# Patient Record
Sex: Female | Born: 1995
Health system: Southern US, Community
[De-identification: ages and names within clinical notes are randomized; demographics above are authoritative.]

## PROBLEM LIST (undated history)

## (undated) ENCOUNTER — Inpatient Hospital Stay: Payer: Self-pay

## (undated) DIAGNOSIS — O99213 Obesity complicating pregnancy, third trimester: Secondary | ICD-10-CM

## (undated) DIAGNOSIS — O99019 Anemia complicating pregnancy, unspecified trimester: Secondary | ICD-10-CM

## (undated) DIAGNOSIS — R51 Headache: Secondary | ICD-10-CM

## (undated) DIAGNOSIS — E669 Obesity, unspecified: Secondary | ICD-10-CM

## (undated) DIAGNOSIS — J45909 Unspecified asthma, uncomplicated: Secondary | ICD-10-CM

## (undated) HISTORY — DX: Obesity, unspecified: E66.9

## (undated) HISTORY — DX: Headache: R51

---
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
## 2016-08-26 ENCOUNTER — Encounter: Payer: Self-pay | Admitting: Emergency Medicine

## 2016-08-26 DIAGNOSIS — R1013 Epigastric pain: Secondary | ICD-10-CM | POA: Insufficient documentation

## 2016-08-26 DIAGNOSIS — J45909 Unspecified asthma, uncomplicated: Secondary | ICD-10-CM | POA: Insufficient documentation

## 2016-08-26 DIAGNOSIS — R1011 Right upper quadrant pain: Secondary | ICD-10-CM | POA: Insufficient documentation

## 2016-08-26 DIAGNOSIS — R110 Nausea: Secondary | ICD-10-CM | POA: Insufficient documentation

## 2016-08-26 DIAGNOSIS — Z791 Long term (current) use of non-steroidal anti-inflammatories (NSAID): Secondary | ICD-10-CM | POA: Insufficient documentation

## 2016-08-26 LAB — URINALYSIS, COMPLETE (UACMP) WITH MICROSCOPIC
BILIRUBIN URINE: NEGATIVE
Glucose, UA: NEGATIVE mg/dL
KETONES UR: 20 mg/dL — AB
Nitrite: NEGATIVE
PH: 5 (ref 5.0–8.0)
Protein, ur: 100 mg/dL — AB
Specific Gravity, Urine: 1.032 — ABNORMAL HIGH (ref 1.005–1.030)

## 2016-08-26 LAB — COMPREHENSIVE METABOLIC PANEL
ALK PHOS: 75 U/L (ref 38–126)
ALT: 26 U/L (ref 14–54)
AST: 30 U/L (ref 15–41)
Albumin: 4.6 g/dL (ref 3.5–5.0)
Anion gap: 6 (ref 5–15)
BILIRUBIN TOTAL: 1.4 mg/dL — AB (ref 0.3–1.2)
BUN: 11 mg/dL (ref 6–20)
CHLORIDE: 108 mmol/L (ref 101–111)
CO2: 23 mmol/L (ref 22–32)
Calcium: 9.4 mg/dL (ref 8.9–10.3)
Creatinine, Ser: 0.95 mg/dL (ref 0.44–1.00)
GFR calc non Af Amer: >60 mL/min (ref >60)
GLUCOSE: 102 mg/dL — AB (ref 65–99)
Potassium: 3.2 mmol/L — ABNORMAL LOW (ref 3.5–5.1)
Sodium: 137 mmol/L (ref 135–145)
Total Protein: 7.9 g/dL (ref 6.5–8.1)

## 2016-08-26 LAB — CBC
HEMATOCRIT: 39.1 % (ref 35.0–47.0)
Hemoglobin: 13.2 g/dL (ref 12.0–16.0)
MCH: 29.3 pg (ref 26.0–34.0)
MCHC: 33.8 g/dL (ref 32.0–36.0)
MCV: 86.6 fL (ref 80.0–100.0)
PLATELETS: 296 10*3/uL (ref 150–440)
RBC: 4.52 MIL/uL (ref 3.80–5.20)
RDW: 13.8 % (ref 11.5–14.5)
WBC: 13.1 10*3/uL — AB (ref 3.6–11.0)

## 2016-08-26 LAB — LIPASE, BLOOD: Lipase: 20 U/L (ref 11–51)

## 2016-08-26 MED ORDER — GI COCKTAIL ~~LOC~~
ORAL | Status: AC
  Administered 2016-08-26: 30 mL
  Filled 2016-08-26: qty 30

## 2016-08-26 NOTE — ED Triage Notes (Signed)
Pt in with co epigastric pain x 3 hours after drinking kool-aid. States vomited x 2 no diarrhea.

## 2016-08-27 ENCOUNTER — Emergency Department: Payer: Self-pay

## 2016-08-27 ENCOUNTER — Emergency Department
Admission: EM | Admit: 2016-08-27 | Discharge: 2016-08-27 | Disposition: A | Discharge status: Home / Self Care | Payer: Self-pay | Attending: Student in an Organized Health Care Education/Training Program | Admitting: Student in an Organized Health Care Education/Training Program

## 2016-08-27 DIAGNOSIS — R110 Nausea: Secondary | ICD-10-CM

## 2016-08-27 DIAGNOSIS — R1013 Epigastric pain: Secondary | ICD-10-CM

## 2016-08-27 DIAGNOSIS — R109 Unspecified abdominal pain: Secondary | ICD-10-CM

## 2016-08-27 LAB — HCG, QUANTITATIVE, PREGNANCY: hCG, Beta Chain, Quant, S: 1 m[IU]/mL (ref <5)

## 2016-08-27 MED ORDER — PROMETHAZINE HCL 25 MG/ML IJ SOLN
25.0000 mg | Freq: Once | INTRAMUSCULAR | Status: DC
  Filled 2016-08-27: qty 1

## 2016-08-27 MED ORDER — SODIUM CHLORIDE 0.9 % IV BOLUS (SEPSIS)
1000.0000 mL | Freq: Once | INTRAVENOUS | Status: AC
  Administered 2016-08-27: 1000 mL via INTRAVENOUS

## 2016-08-27 MED ORDER — PROMETHAZINE HCL 25 MG/ML IJ SOLN
25.0000 mg | Freq: Once | INTRAMUSCULAR | Status: AC
  Administered 2016-08-27: 25 mg via INTRAVENOUS

## 2016-08-27 MED ORDER — KETOROLAC TROMETHAMINE 30 MG/ML IJ SOLN
30.0000 mg | Freq: Once | INTRAMUSCULAR | Status: DC
  Filled 2016-08-27: qty 1

## 2016-08-27 MED ORDER — KETOROLAC TROMETHAMINE 30 MG/ML IJ SOLN
30.0000 mg | Freq: Once | INTRAMUSCULAR | Status: AC
  Administered 2016-08-27: 30 mg via INTRAVENOUS

## 2016-08-27 MED ORDER — PROMETHAZINE HCL 12.5 MG PO TABS: 12.5000 mg | ORAL_TABLET | Freq: Four times a day (QID) | ORAL | 0 refills | Status: DC | PRN

## 2016-08-27 MED ORDER — FENTANYL CITRATE (PF) 100 MCG/2ML IJ SOLN
50.0000 ug | INTRAMUSCULAR | Status: DC | PRN
  Administered 2016-08-27: 50 ug via INTRAVENOUS
  Filled 2016-08-27: qty 2

## 2016-08-27 NOTE — ED Notes (Signed)
Reviewed d/c instruction, follow-up care and prescription with patient and patient's mother. Pt/mother verbalized understanding

## 2016-08-27 NOTE — ED Provider Notes (Signed)
[facility] Emergency Department Provider Note    First MD Initiated Contact with Patient 08/27/16 0327     (approximate)  I have reviewed the triage vital signs and the nursing notes.   HISTORY  Chief Complaint Abdominal Pain    HPI Amanda Buck is a 21 y.o. female history of obesity presents with acute epigastric pain rates as 10 out of 10 in severity that started around 8 PM after the patient drank Kool-Aid at home. Has had multiple episodes of nonbilious nonbloody emesis. No measured fevers. Has never had symptoms like this before. States the pain is radiating through to her back. No diarrhea. No dysuria. No flank pain.   Past Medical History:  Diagnosis Date  . Anemia affecting pregnancy   . Asthma    No current treatment needed  . Headache(784.0)   . Obesity affecting pregnancy in third trimester, antepartum    Family History  Problem Relation Age of Onset  . ALS Paternal Grandmother        Died at 37  . Heart failure Paternal Grandfather        Died at 81   Past Surgical History:  Procedure Laterality Date  . NO PAST SURGERIES     Patient Active Problem List   Diagnosis Date Noted  . Postpartum care following vaginal delivery 11/30/2015      Prior to Admission medications   Medication Sig Start Date End Date Taking? Authorizing Provider  ibuprofen (ADVIL,MOTRIN) 600 MG tablet Take 1 tablet (600 mg total) by mouth every 6 (six) hours as needed for headache, mild pain, moderate pain or cramping. 12/02/15   Farrel Conners, CNM  Prenatal Vit-Fe Fumarate-FA (PRENATAL MULTIVITAMIN) TABS tablet Take 1 tablet by mouth daily at 12 noon. 12/02/15   Farrel Conners, CNM    Allergies Patient has no known allergies.    Social History Social History  Substance Use Topics  . Smoking status: Never Smoker  . Smokeless tobacco: Never Used  . Alcohol use No    Review of Systems Patient denies headaches, rhinorrhea, blurry vision,  numbness, shortness of breath, chest pain, edema, cough, abdominal pain, nausea, vomiting, diarrhea, dysuria, fevers, rashes or hallucinations unless otherwise stated above in HPI. ____________________________________________   PHYSICAL EXAM:  VITAL SIGNS: Vitals:   08/26/16 2318  BP: 119/76  Pulse: 68  Resp: 20  Temp: 98.2 F (36.8 C)    Constitutional: Alert and oriented. in no acute distress. Eyes: Conjunctivae are normal. PERRL. EOMI. Head: Atraumatic. Nose: No congestion/rhinnorhea. Mouth/Throat: Mucous membranes are moist.  Oropharynx non-erythematous. Neck: No stridor. Painless ROM. No cervical spine tenderness to palpation Hematological/Lymphatic/Immunilogical: No cervical lymphadenopathy. Cardiovascular: Normal rate, regular rhythm. Grossly normal heart sounds.  Good peripheral circulation. Respiratory: Normal respiratory effort.  No retractions. Lungs CTAB. Gastrointestinal: Soft with mild ruq ttp, no rebound or guarding. No distention. No abdominal bruits. No CVA tenderness. Musculoskeletal: No lower extremity tenderness nor edema.  No joint effusions. Neurologic:  Normal speech and language. No gross focal neurologic deficits are appreciated. No gait instability. Skin:  Skin is warm, dry and intact. No rash noted. Psychiatric: Mood and affect are normal. Speech and behavior are normal.  ____________________________________________   LABS (all labs ordered are listed, but only abnormal results are displayed)  Results for orders placed or performed during the hospital encounter of 08/27/16 (from the past 24 hour(s))  CBC     Status: Abnormal   Collection Time: 08/26/16 11:18 PM  Result Value Ref Range  WBC 13.1 (H) 3.6 - 11.0 K/uL   RBC 4.52 3.80 - 5.20 MIL/uL   Hemoglobin 13.2 12.0 - 16.0 g/dL   HCT 16.1 09.6 - 04.5 %   MCV 86.6 80.0 - 100.0 fL   MCH 29.3 26.0 - 34.0 pg   MCHC 33.8 32.0 - 36.0 g/dL   RDW 40.9 81.1 - 91.4 %   Platelets 296 150 - 440 K/uL    Comprehensive metabolic panel     Status: Abnormal   Collection Time: 08/26/16 11:18 PM  Result Value Ref Range   Sodium 137 135 - 145 mmol/L   Potassium 3.2 (L) 3.5 - 5.1 mmol/L   Chloride 108 101 - 111 mmol/L   CO2 23 22 - 32 mmol/L   Glucose, Bld 102 (H) 65 - 99 mg/dL   BUN 11 6 - 20 mg/dL   Creatinine, Ser 7.82 0.44 - 1.00 mg/dL   Calcium 9.4 8.9 - 95.6 mg/dL   Total Protein 7.9 6.5 - 8.1 g/dL   Albumin 4.6 3.5 - 5.0 g/dL   AST 30 15 - 41 U/L   ALT 26 14 - 54 U/L   Alkaline Phosphatase 75 38 - 126 U/L   Total Bilirubin 1.4 (H) 0.3 - 1.2 mg/dL   GFR calc non Af Amer >60 >60 mL/min   GFR calc Af Amer >60 >60 mL/min   Anion gap 6 5 - 15  Lipase, blood     Status: None   Collection Time: 08/26/16 11:18 PM  Result Value Ref Range   Lipase 20 11 - 51 U/L  Urinalysis, Complete w Microscopic     Status: Abnormal   Collection Time: 08/26/16 11:23 PM  Result Value Ref Range   Color, Urine AMBER (A) YELLOW   APPearance CLOUDY (A) CLEAR   Specific Gravity, Urine 1.032 (H) 1.005 - 1.030   pH 5.0 5.0 - 8.0   Glucose, UA NEGATIVE NEGATIVE mg/dL   Hgb urine dipstick SMALL (A) NEGATIVE   Bilirubin Urine NEGATIVE NEGATIVE   Ketones, ur 20 (A) NEGATIVE mg/dL   Protein, ur 213 (A) NEGATIVE mg/dL   Nitrite NEGATIVE NEGATIVE   Leukocytes, UA LARGE (A) NEGATIVE   RBC / HPF 6-30 0 - 5 RBC/hpf   WBC, UA TOO NUMEROUS TO COUNT 0 - 5 WBC/hpf   Bacteria, UA MANY (A) NONE SEEN   Squamous Epithelial / LPF TOO NUMEROUS TO COUNT (A) NONE SEEN   Mucous PRESENT    Non Squamous Epithelial 0-5 (A) NONE SEEN   ____________________________________________  EKG________________________________  RADIOLOGY  I personally reviewed all radiographic images ordered to evaluate for the above acute complaints and reviewed radiology reports and findings.  These findings were personally discussed with the patient.  Please see medical record for radiology  report.  ____________________________________________   PROCEDURES  Procedure(s) performed:  Procedures    Critical Care performed: no ____________________________________________   INITIAL IMPRESSION / ASSESSMENT AND PLAN / ED COURSE  Pertinent labs & imaging results that were available during my care of the patient were reviewed by me and considered in my medical decision making (see chart for details).  DDX: cholelithiasis,gastritis, enteritis, esohagitis, cholecytitis, ood poisoning  Amanda Buck is a 21 y.o. who presents to the ED with acute right upper quadrant and epigastric pain as described above. Patient afebrile and nontoxic-appearing. Does have mild tenderness in the upper quadrant with mild leukocytosis and mild elevation of her bilirubin. Will order ultrasound evaluate for any evidence of biliary pathology. Do not  feel that CT imaging is clinically indicated at this time. We'll provide symptomatically management IV fluids.    ----------------------------------------- 6:56 AM on 08/27/2016 -----------------------------------------  Ultrasound shows no acute abnormality. Patient resting comfortably and repeat abdominal exam is soft and benign.  Patient adamantly denies that she has no dysuria or flank pain. No increased frequency or change in odor. Patient with multiple squames on her urinalysis which suggests this is a contaminated specimen. Do not feel this is related to her symptoms.  Patient was able to tolerate PO and was able to ambulate with a steady gait.  Have discussed with the patient and available family all diagnostics and treatments performed thus far and all questions were answered to the best of my ability. The patient demonstrates understanding and agreement with plan.   ____________________________________________   FINAL CLINICAL IMPRESSION(S) / ED DIAGNOSES  Final diagnoses:  Abdominal pain  Epigastric abdominal pain  Nausea      NEW  MEDICATIONS STARTED DURING THIS VISIT:  New Prescriptions   No medications on file     Note:  This document was prepared using Dragon voice recognition software and may include unintentional dictation errors.    Willy Eddyobinson, Tifanny Dollens, MD 08/27/16 [phone]

## 2016-08-27 NOTE — ED Notes (Signed)
Eileen StanfordJenna RN, and Butch RN made 2 unsuccessful attempts at IV insertion. Raquel RN at bedside to attempt IV insertion

## 2016-08-27 NOTE — ED Notes (Signed)
2 unsuccessful PIV attempts by this RN (RIGHT forearm and LEFT hand).

## 2016-08-27 NOTE — ED Notes (Signed)
Patient returned from ultrasound.

## 2016-08-27 NOTE — Discharge Instructions (Signed)

## 2016-08-27 NOTE — ED Notes (Signed)
RN to room. Patient sleeping. RN woke patient for assessment and vital signs. Patient c/o 10 out of 10 epigastric pain, N/V/D beginning 5/24 at 2000. Pt reports 5 emesis in the last 24 hours. Pt is tender to palpation of abdomen. Pt denies urinary complaints.

## 2016-12-27 ENCOUNTER — Emergency Department (HOSPITAL_BASED_OUTPATIENT_CLINIC_OR_DEPARTMENT_OTHER): Payer: Self-pay

## 2016-12-27 ENCOUNTER — Encounter (HOSPITAL_BASED_OUTPATIENT_CLINIC_OR_DEPARTMENT_OTHER): Payer: Self-pay | Admitting: *Deleted

## 2016-12-27 ENCOUNTER — Inpatient Hospital Stay (HOSPITAL_BASED_OUTPATIENT_CLINIC_OR_DEPARTMENT_OTHER)
Admission: EM | Admit: 2016-12-27 | Discharge: 2016-12-30 | DRG: 343 | Disposition: A | Discharge status: Home / Self Care | Payer: Self-pay | Attending: Surgery | Admitting: Surgery

## 2016-12-27 DIAGNOSIS — K37 Unspecified appendicitis: Secondary | ICD-10-CM

## 2016-12-27 DIAGNOSIS — Z79899 Other long term (current) drug therapy: Secondary | ICD-10-CM

## 2016-12-27 DIAGNOSIS — K3580 Unspecified acute appendicitis: Principal | ICD-10-CM | POA: Diagnosis present

## 2016-12-27 DIAGNOSIS — J45909 Unspecified asthma, uncomplicated: Secondary | ICD-10-CM | POA: Diagnosis present

## 2016-12-27 HISTORY — DX: Unspecified appendicitis: K37

## 2016-12-27 LAB — COMPREHENSIVE METABOLIC PANEL
ALK PHOS: 66 U/L (ref 38–126)
ALT: 22 U/L (ref 14–54)
ANION GAP: 11 (ref 5–15)
AST: 23 U/L (ref 15–41)
Albumin: 4.5 g/dL (ref 3.5–5.0)
BILIRUBIN TOTAL: 1 mg/dL (ref 0.3–1.2)
BUN: 9 mg/dL (ref 6–20)
CALCIUM: 9.3 mg/dL (ref 8.9–10.3)
CO2: 25 mmol/L (ref 22–32)
CREATININE: 0.87 mg/dL (ref 0.44–1.00)
Chloride: 103 mmol/L (ref 101–111)
GFR calc non Af Amer: >60 mL/min (ref >60)
Glucose, Bld: 99 mg/dL (ref 65–99)
Potassium: 3.7 mmol/L (ref 3.5–5.1)
Sodium: 139 mmol/L (ref 135–145)
TOTAL PROTEIN: 7.5 g/dL (ref 6.5–8.1)

## 2016-12-27 LAB — CBC
HCT: 41.5 % (ref 36.0–46.0)
HEMOGLOBIN: 13.7 g/dL (ref 12.0–15.0)
MCH: 29.3 pg (ref 26.0–34.0)
MCHC: 33 g/dL (ref 30.0–36.0)
MCV: 88.9 fL (ref 78.0–100.0)
PLATELETS: 269 10*3/uL (ref 150–400)
RBC: 4.67 MIL/uL (ref 3.87–5.11)
RDW: 12.8 % (ref 11.5–15.5)
WBC: 16 10*3/uL — AB (ref 4.0–10.5)

## 2016-12-27 LAB — URINALYSIS, ROUTINE W REFLEX MICROSCOPIC
Bilirubin Urine: NEGATIVE
Glucose, UA: NEGATIVE mg/dL
Hgb urine dipstick: NEGATIVE
Ketones, ur: 15 mg/dL — AB
Nitrite: NEGATIVE
Protein, ur: NEGATIVE mg/dL
Specific Gravity, Urine: >1.03 — ABNORMAL HIGH (ref 1.005–1.030)
pH: 6 (ref 5.0–8.0)

## 2016-12-27 LAB — URINALYSIS, MICROSCOPIC (REFLEX)

## 2016-12-27 LAB — LIPASE, BLOOD: Lipase: 33 U/L (ref 11–51)

## 2016-12-27 LAB — PREGNANCY, URINE: Preg Test, Ur: NEGATIVE

## 2016-12-27 MED ORDER — HYDROMORPHONE HCL-NACL 0.5-0.9 MG/ML-% IV SOSY
0.5000 mg | PREFILLED_SYRINGE | INTRAVENOUS | Status: DC | PRN
  Administered 2016-12-28: 1 mg via INTRAVENOUS
  Administered 2016-12-29: 0.5 mg via INTRAVENOUS
  Filled 2016-12-27: qty 1
  Filled 2016-12-27: qty 2
  Filled 2016-12-27: qty 1

## 2016-12-27 MED ORDER — IBUPROFEN 400 MG PO TABS
600.0000 mg | ORAL_TABLET | Freq: Four times a day (QID) | ORAL | Status: DC | PRN
  Administered 2016-12-28: 600 mg via ORAL
  Filled 2016-12-27: qty 1

## 2016-12-27 MED ORDER — ACETAMINOPHEN 325 MG PO TABS
650.0000 mg | ORAL_TABLET | Freq: Four times a day (QID) | ORAL | Status: DC | PRN
  Administered 2016-12-28: 650 mg via ORAL
  Filled 2016-12-27: qty 2

## 2016-12-27 MED ORDER — DEXTROSE 5 % IV SOLN
2.0000 g | Freq: Every day | INTRAVENOUS | Status: DC
  Administered 2016-12-28 – 2016-12-29 (×3): 2 g via INTRAVENOUS
  Filled 2016-12-27 (×3): qty 2

## 2016-12-27 MED ORDER — SODIUM CHLORIDE 0.9 % IV BOLUS (SEPSIS)
1000.0000 mL | Freq: Once | INTRAVENOUS | Status: AC
  Administered 2016-12-27: 1000 mL via INTRAVENOUS

## 2016-12-27 MED ORDER — KCL IN DEXTROSE-NACL 20-5-0.45 MEQ/L-%-% IV SOLN
INTRAVENOUS | Status: DC
  Administered 2016-12-28 – 2016-12-30 (×3): via INTRAVENOUS
  Filled 2016-12-27 (×5): qty 1000

## 2016-12-27 MED ORDER — ONDANSETRON HCL 4 MG/2ML IJ SOLN
4.0000 mg | Freq: Once | INTRAMUSCULAR | Status: AC
  Administered 2016-12-27: 4 mg via INTRAVENOUS
  Filled 2016-12-27: qty 2

## 2016-12-27 MED ORDER — ZOLPIDEM TARTRATE 5 MG PO TABS: 5.0000 mg | ORAL_TABLET | Freq: Every evening | ORAL | Status: DC | PRN

## 2016-12-27 MED ORDER — DIPHENHYDRAMINE HCL 50 MG/ML IJ SOLN: 12.5000 mg | Freq: Four times a day (QID) | INTRAMUSCULAR | Status: DC | PRN

## 2016-12-27 MED ORDER — HYDROMORPHONE HCL 1 MG/ML IJ SOLN
1.0000 mg | Freq: Once | INTRAMUSCULAR | Status: AC
  Administered 2016-12-27: 1 mg via INTRAVENOUS
  Filled 2016-12-27: qty 1

## 2016-12-27 MED ORDER — ONDANSETRON HCL 4 MG/2ML IJ SOLN
4.0000 mg | Freq: Four times a day (QID) | INTRAMUSCULAR | Status: DC | PRN
  Administered 2016-12-28 – 2016-12-29 (×2): 4 mg via INTRAVENOUS
  Filled 2016-12-27 (×2): qty 2

## 2016-12-27 MED ORDER — DIPHENHYDRAMINE HCL 12.5 MG/5ML PO ELIX: 12.5000 mg | ORAL_SOLUTION | Freq: Four times a day (QID) | ORAL | Status: DC | PRN

## 2016-12-27 MED ORDER — SODIUM CHLORIDE 0.9 % IV SOLN: INTRAVENOUS | Status: DC

## 2016-12-27 MED ORDER — ONDANSETRON 4 MG PO TBDP: 4.0000 mg | ORAL_TABLET | Freq: Once | ORAL | Status: DC

## 2016-12-27 MED ORDER — PIPERACILLIN-TAZOBACTAM 3.375 G IVPB 30 MIN
3.3750 g | Freq: Once | INTRAVENOUS | Status: AC
  Administered 2016-12-27: 3.375 g via INTRAVENOUS
  Filled 2016-12-27 (×2): qty 50

## 2016-12-27 MED ORDER — ONDANSETRON HCL 4 MG/2ML IJ SOLN
4.0000 mg | Freq: Once | INTRAMUSCULAR | Status: AC | PRN
  Administered 2016-12-27: 4 mg via INTRAVENOUS
  Filled 2016-12-27: qty 2

## 2016-12-27 MED ORDER — ONDANSETRON 4 MG PO TBDP: 4.0000 mg | ORAL_TABLET | Freq: Four times a day (QID) | ORAL | Status: DC | PRN

## 2016-12-27 MED ORDER — ACETAMINOPHEN 650 MG RE SUPP
650.0000 mg | Freq: Four times a day (QID) | RECTAL | Status: DC | PRN
Start: 2016-12-27 — End: 2016-12-28

## 2016-12-27 MED ORDER — SIMETHICONE 80 MG PO CHEW
40.0000 mg | CHEWABLE_TABLET | Freq: Four times a day (QID) | ORAL | Status: DC | PRN
  Administered 2016-12-29: 40 mg via ORAL
  Filled 2016-12-27: qty 1

## 2016-12-27 MED ORDER — IOPAMIDOL (ISOVUE-300) INJECTION 61%
100.0000 mL | Freq: Once | INTRAVENOUS | Status: AC | PRN
  Administered 2016-12-27: 100 mL via INTRAVENOUS

## 2016-12-27 MED ORDER — METRONIDAZOLE IN NACL 5-0.79 MG/ML-% IV SOLN
500.0000 mg | Freq: Three times a day (TID) | INTRAVENOUS | Status: DC
  Administered 2016-12-28 – 2016-12-30 (×8): 500 mg via INTRAVENOUS
  Filled 2016-12-27 (×8): qty 100

## 2016-12-27 NOTE — ED Notes (Signed)
ED Provider at bedside. 

## 2016-12-27 NOTE — ED Triage Notes (Signed)
Woke this am with reflux. She took OTC medication with no relief. Vomiting.

## 2016-12-27 NOTE — H&P (Signed)
Amanda Buck is an 21 y.o. female.   Chief Complaint: abdominal pain HPI:  Pt is a 21 yo F sent from [facility] with around 14-16 hours of abdominal pain.  She woke up around 10 AM with abdominal pain that progressed to generalized pain.  She had significant nausea/vomiting.  Because of the nausea and generalized pain, a RUQ u/s was performed initially.  This was negative, but the patient still felt very sore and sick.  Ct was performed and was positive for acute appendicitis.  She denies fever/chills.  She described the pain as severe.  She has never had anything like this before.  She denies sick contacts.    Past Medical History:  Diagnosis Date  . Anemia affecting pregnancy   . Asthma    No current treatment needed  . Headache(784.0)   . Obesity affecting pregnancy in third trimester, antepartum     Past Surgical History:  Procedure Laterality Date  . NO PAST SURGERIES      Family History  Problem Relation Age of Onset  . ALS Paternal Grandmother        Died at 39  . Heart failure Paternal Grandfather        Died at 73   Social History:  reports that she has never smoked. She has never used smokeless tobacco. She reports that she does not drink alcohol or use drugs.  Allergies: No Known Allergies  Medications Prior to Admission  Medication Sig Dispense Refill  . ibuprofen (ADVIL,MOTRIN) 600 MG tablet Take 1 tablet (600 mg total) by mouth every 6 (six) hours as needed for headache, mild pain, moderate pain or cramping. 50 tablet 0  . Prenatal Vit-Fe Fumarate-FA (PRENATAL MULTIVITAMIN) TABS tablet Take 1 tablet by mouth daily at 12 noon.    . promethazine (PHENERGAN) 12.5 MG tablet Take 1 tablet (12.5 mg total) by mouth every 6 (six) hours as needed for nausea or vomiting. 12 tablet 0    Results for orders placed or performed during the hospital encounter of 12/27/16 (from the past 48 hour(s))  Urinalysis, Routine w reflex microscopic     Status: Abnormal   Collection Time: 12/27/16  3:09 PM  Result Value Ref Range   Color, Urine YELLOW YELLOW   APPearance CLEAR CLEAR   Specific Gravity, Urine >1.030 (H) 1.005 - 1.030   pH 6.0 5.0 - 8.0   Glucose, UA NEGATIVE NEGATIVE mg/dL   Hgb urine dipstick NEGATIVE NEGATIVE   Bilirubin Urine NEGATIVE NEGATIVE   Ketones, ur 15 (A) NEGATIVE mg/dL   Protein, ur NEGATIVE NEGATIVE mg/dL   Nitrite NEGATIVE NEGATIVE   Leukocytes, UA TRACE (A) NEGATIVE  Pregnancy, urine     Status: None   Collection Time: 12/27/16  3:09 PM  Result Value Ref Range   Preg Test, Ur NEGATIVE NEGATIVE    Comment:        THE SENSITIVITY OF THIS METHODOLOGY IS >20 mIU/mL.   Urinalysis, Microscopic (reflex)     Status: Abnormal   Collection Time: 12/27/16  3:09 PM  Result Value Ref Range   RBC / HPF 0-5 0 - 5 RBC/hpf   WBC, UA 0-5 0 - 5 WBC/hpf   Bacteria, UA FEW (A) NONE SEEN   Squamous Epithelial / LPF 0-5 (A) NONE SEEN   Mucus PRESENT   Lipase, blood     Status: None   Collection Time: 12/27/16  4:56 PM  Result Value Ref Range   Lipase 33 11 -  51 U/L  Comprehensive metabolic panel     Status: None   Collection Time: 12/27/16  4:56 PM  Result Value Ref Range   Sodium 139 135 - 145 mmol/L   Potassium 3.7 3.5 - 5.1 mmol/L   Chloride 103 101 - 111 mmol/L   CO2 25 22 - 32 mmol/L   Glucose, Bld 99 65 - 99 mg/dL   BUN 9 6 - 20 mg/dL   Creatinine, Ser 0.87 0.44 - 1.00 mg/dL   Calcium 9.3 8.9 - 10.3 mg/dL   Total Protein 7.5 6.5 - 8.1 g/dL   Albumin 4.5 3.5 - 5.0 g/dL   AST 23 15 - 41 U/L   ALT 22 14 - 54 U/L   Alkaline Phosphatase 66 38 - 126 U/L   Total Bilirubin 1.0 0.3 - 1.2 mg/dL   GFR calc non Af Amer >60 >60 mL/min   GFR calc Af Amer >60 >60 mL/min    Comment: (NOTE) The eGFR has been calculated using the CKD EPI equation. This calculation has not been validated in all clinical situations. eGFR's persistently <60 mL/min signify possible Chronic Kidney Disease.    Anion gap 11 5 - 15  CBC     Status:  Abnormal   Collection Time: 12/27/16  4:56 PM  Result Value Ref Range   WBC 16.0 (H) 4.0 - 10.5 K/uL   RBC 4.67 3.87 - 5.11 MIL/uL   Hemoglobin 13.7 12.0 - 15.0 g/dL   HCT 41.5 36.0 - 46.0 %   MCV 88.9 78.0 - 100.0 fL   MCH 29.3 26.0 - 34.0 pg   MCHC 33.0 30.0 - 36.0 g/dL   RDW 12.8 11.5 - 15.5 %   Platelets 269 150 - 400 K/uL   Ct Abdomen Pelvis W Contrast  Result Date: 12/27/2016 CLINICAL DATA:  Patient with vomiting.  Lower abdominal pain. EXAM: CT ABDOMEN AND PELVIS WITH CONTRAST TECHNIQUE: Multidetector CT imaging of the abdomen and pelvis was performed using the standard protocol following bolus administration of intravenous contrast. CONTRAST:  18m ISOVUE-300 IOPAMIDOL (ISOVUE-300) INJECTION 61% COMPARISON:  None. FINDINGS: Lower chest: Normal heart size. Lung bases are clear. No pleural effusion. Hepatobiliary: Liver is normal in size and contour. Fatty deposition adjacent to the falciform ligament. Gallbladder is unremarkable. No intrahepatic or extrahepatic biliary ductal dilatation. Pancreas: Unremarkable Spleen: Unremarkable Adrenals/Urinary Tract: Normal adrenal glands. Kidneys are symmetric in size. No hydronephrosis. Urinary bladder is unremarkable. Stomach/Bowel: The appendix is thick walled and dilated within the right lower quadrant measuring up to 9 mm (image 56; series 5). No evidence for perforation. No definite appendicolith identified. No evidence for bowel obstruction. Normal morphology of the stomach. Vascular/Lymphatic: Normal caliber abdominal aorta. No retroperitoneal lymphadenopathy. Reproductive: Intrauterine device is present. Adnexal structures are unremarkable. Small amount of free fluid in the pelvis. Other: None. Musculoskeletal: No aggressive or acute appearing osseous lesions. IMPRESSION: Findings compatible with acute non perforated appendicitis. Electronically Signed   By: DLovey NewcomerM.D.   On: 12/27/2016 20:51    Review of Systems  Gastrointestinal:  Positive for abdominal pain, nausea and vomiting.  All other systems reviewed and are negative.   Blood pressure 111/74, pulse 81, temperature 98.9 F (37.2 C), temperature source Oral, resp. rate 16, height _0  (1.626 m), weight 90.7 kg (200 lb), SpO2 100 %, unknown if currently breastfeeding. Physical Exam  Constitutional: She is oriented to person, place, and time. She appears well-developed and well-nourished. She appears distressed.  HENT:  Head: Normocephalic and atraumatic.  Right Ear: External ear normal.  Left Ear: External ear normal.  Eyes: Pupils are equal, round, and reactive to light. Conjunctivae are normal. Right eye exhibits no discharge. Left eye exhibits no discharge. No scleral icterus.  Neck: Neck supple. No tracheal deviation present. No thyromegaly present.  Cardiovascular: Normal rate, regular rhythm, normal heart sounds and intact distal pulses.   Respiratory: Effort normal and breath sounds normal. No respiratory distress. She has no wheezes. She has no rales. She exhibits no tenderness.  GI: Soft. She exhibits no distension. There is tenderness. There is no rebound and no guarding.  Musculoskeletal: Normal range of motion. She exhibits no edema, tenderness or deformity.  Neurological: She is alert and oriented to person, place, and time. Coordination normal.  Skin: Skin is warm and dry. No rash noted. She is not diaphoretic. No erythema. No pallor.  Psychiatric: She has a normal mood and affect. Her behavior is normal. Judgment and thought content normal.     Assessment/Plan Acute appendicitis NPO IV fluids IV antibiotics  OR planned tomorrow with Dr. Kae Heller.   Discussed surgery and risks with patient.    Stark Klein, MD 12/27/2016, 11:41 PM

## 2016-12-27 NOTE — ED Provider Notes (Signed)
[facility] Provider Note   CSN: [id number] Arrival date & time: 12/27/16  1502     History   Chief Complaint Chief Complaint  Patient presents with  . Abdominal Pain  . Emesis    HPI Amanda Buck is a 21 y.o. female.  Patient went to bed around for the morning. Woke up around 10 or 11 in the morning. Had abdominal pain and now multiple episodes of vomiting. Patient felt fine when she went to bed. Continue with vomiting throughout the day and in complaint of generalized abdominal pain. Patient stated pain was 10 out of 10. Patient very miserable upon presentation. No prior surgical history.      Past Medical History:  Diagnosis Date  . Anemia affecting pregnancy   . Asthma    No current treatment needed  . Headache(784.0)   . Obesity affecting pregnancy in third trimester, antepartum     Patient Active Problem List   Diagnosis Date Noted  . Postpartum care following vaginal delivery 11/30/2015    Past Surgical History:  Procedure Laterality Date  . NO PAST SURGERIES      OB History    Gravida Para Term Preterm AB Living   0 0 1   SAB TAB Ectopic Multiple Live Births   0 0 0 0 1       Home Medications    Prior to Admission medications   Medication Sig Start Date End Date Taking? Authorizing Provider  ibuprofen (ADVIL,MOTRIN) 600 MG tablet Take 1 tablet (600 mg total) by mouth every 6 (six) hours as needed for headache, mild pain, moderate pain or cramping. 12/02/15   Farrel Conners, CNM  Prenatal Vit-Fe Fumarate-FA (PRENATAL MULTIVITAMIN) TABS tablet Take 1 tablet by mouth daily at 12 noon. 12/02/15   Farrel Conners, CNM  promethazine (PHENERGAN) 12.5 MG tablet Take 1 tablet (12.5 mg total) by mouth every 6 (six) hours as needed for nausea or vomiting. 08/27/16   Willy Eddy, MD    Family History Family History  Problem Relation Age of Onset  . ALS Paternal Grandmother        Died at 97  . Heart failure Paternal  Grandfather        Died at 44    Social History Social History  Substance Use Topics  . Smoking status: Never Smoker  . Smokeless tobacco: Never Used  . Alcohol use No     Allergies   Patient has no known allergies.   Review of Systems Review of Systems  Constitutional: Negative for fever.  HENT: Negative for congestion.   Eyes: Negative for redness.  Respiratory: Negative for shortness of breath.   Cardiovascular: Negative for chest pain.  Gastrointestinal: Positive for abdominal pain, nausea and vomiting.  Genitourinary: Negative for dysuria.  Musculoskeletal: Negative for back pain.  Skin: Negative for rash.  Neurological: Negative for headaches.  Hematological: Does not bruise/bleed easily.  Psychiatric/Behavioral: Negative for confusion.     Physical Exam Updated Vital Signs BP 106/69 (BP Location: Left Arm)   Pulse 83   Temp 98.2 F (36.8 C) (Oral)   Resp 16   Ht 1.626 m ( )   Wt 90.7 kg (200 lb)   SpO2 100%   BMI 34.33 kg/m   Physical Exam  Constitutional: She is oriented to person, place, and time. She appears well-developed and well-nourished. She appears distressed.  HENT:  Head: Normocephalic and atraumatic.  Mucous membranes dry.  Eyes: Pupils are equal, round,  and reactive to light. Conjunctivae and EOM are normal.  Neck: Normal range of motion. Neck supple.  Cardiovascular: Normal rate, regular rhythm and normal heart sounds.   Pulmonary/Chest: Effort normal and breath sounds normal.  Abdominal: Soft. Bowel sounds are normal. There is tenderness.  Generalized tenderness to the abdomen. No guarding  Musculoskeletal: Normal range of motion.  Neurological: She is alert and oriented to person, place, and time. No cranial nerve deficit or sensory deficit. She exhibits normal muscle tone. Coordination normal.  Skin: Skin is warm. No rash noted.  Nursing note and vitals reviewed.    ED Treatments / Results  Labs (all labs ordered are  listed, but only abnormal results are displayed) Labs Reviewed  URINALYSIS, ROUTINE W REFLEX MICROSCOPIC - Abnormal; Notable for the following:       Result Value   Specific Gravity, Urine >1.030 (*)    Ketones, ur 15 (*)    Leukocytes, UA TRACE (*)    All other components within normal limits  URINALYSIS, MICROSCOPIC (REFLEX) - Abnormal; Notable for the following:    Bacteria, UA FEW (*)    Squamous Epithelial / LPF 0-5 (*)    All other components within normal limits  CBC - Abnormal; Notable for the following:    WBC 16.0 (*)    All other components within normal limits  PREGNANCY, URINE  LIPASE, BLOOD  COMPREHENSIVE METABOLIC PANEL    EKG  EKG Interpretation None       Radiology Ct Abdomen Pelvis W Contrast  Result Date: 12/27/2016 CLINICAL DATA:  Patient with vomiting.  Lower abdominal pain. EXAM: CT ABDOMEN AND PELVIS WITH CONTRAST TECHNIQUE: Multidetector CT imaging of the abdomen and pelvis was performed using the standard protocol following bolus administration of intravenous contrast. CONTRAST:  ISOVUE-300 IOPAMIDOL (ISOVUE-300) INJECTION 61% COMPARISON:  None. FINDINGS: Lower chest: Normal heart size. Lung bases are clear. No pleural effusion. Hepatobiliary: Liver is normal in size and contour. Fatty deposition adjacent to the falciform ligament. Gallbladder is unremarkable. No intrahepatic or extrahepatic biliary ductal dilatation. Pancreas: Unremarkable Spleen: Unremarkable Adrenals/Urinary Tract: Normal adrenal glands. Kidneys are symmetric in size. No hydronephrosis. Urinary bladder is unremarkable. Stomach/Bowel: The appendix is thick walled and dilated within the right lower quadrant measuring up to 9 mm (image 56; series 5). No evidence for perforation. No definite appendicolith identified. No evidence for bowel obstruction. Normal morphology of the stomach. Vascular/Lymphatic: Normal caliber abdominal aorta. No retroperitoneal lymphadenopathy. Reproductive:  Intrauterine device is present. Adnexal structures are unremarkable. Small amount of free fluid in the pelvis. Other: None. Musculoskeletal: No aggressive or acute appearing osseous lesions. IMPRESSION: Findings compatible with acute non perforated appendicitis. Electronically Signed   By: Annia Belt M.D.   On: 12/27/2016 20:51    Procedures Procedures (including critical care time)  Medications Ordered in ED Medications  0.9 %  sodium chloride infusion ( Intravenous Not Given 12/27/16 1700)  ondansetron (ZOFRAN) injection 4 mg (4 mg Intravenous Given 12/27/16 1709)  sodium chloride 0.9 % bolus 1,000 mL (0 mLs Intravenous Stopped 12/27/16 1755)  HYDROmorphone (DILAUDID) injection 1 mg (1 mg Intravenous Given 12/27/16 1710)  sodium chloride 0.9 % bolus 1,000 mL (1,000 mLs Intravenous New Bag/Given 12/27/16 1932)  ondansetron (ZOFRAN) injection 4 mg (4 mg Intravenous Given 12/27/16 1930)  iopamidol (ISOVUE-300) 61 % injection 100 mL (100 mLs Intravenous Contrast Given 12/27/16 2007)     Initial Impression / Assessment and Plan / ED Course  I have reviewed the triage vital signs and  the nursing notes.  Pertinent labs & imaging results that were available during my care of the patient were reviewed by me and considered in my medical decision making (see chart for details).    CT scan not confirmatory for appendicitis. Patient had significant improvement after IV fluids and pain medicine and antinausea medicine. But still had a generalized abdominal ache. Really had no point tenderness in the right lower quadrant. CT scan was done because pain was pretty significant throughout the abdomen upon presentation. And she also had a leukocytosis. Discussed with on-call surgery at [facility] of they have accepted her. She'll be transferred by [facility]. Will be given Zosyn IV antibiotic.  The patient nontoxic no acute distress. Feeling much better. Patient is not pregnant.   Final Clinical  Impressions(s) / ED Diagnoses   Final diagnoses:  Acute appendicitis, unspecified acute appendicitis type    New Prescriptions New Prescriptions   No medications on file     Vanetta Mulders, MD 12/27/16 2119

## 2016-12-27 NOTE — ED Notes (Signed)
Report given to Caitlin,RN.

## 2016-12-28 ENCOUNTER — Encounter (HOSPITAL_COMMUNITY): Payer: Self-pay | Admitting: Registered Nurse

## 2016-12-28 ENCOUNTER — Encounter (HOSPITAL_COMMUNITY): Admission: EM | Disposition: A | Discharge status: Home / Self Care | Payer: Self-pay | Source: Home / Self Care

## 2016-12-28 ENCOUNTER — Inpatient Hospital Stay (HOSPITAL_COMMUNITY): Payer: Self-pay | Admitting: Registered Nurse

## 2016-12-28 HISTORY — PX: LAPAROSCOPIC APPENDECTOMY: SHX408

## 2016-12-28 LAB — CBC
HCT: 38.8 % (ref 36.0–46.0)
HEMOGLOBIN: 12.6 g/dL (ref 12.0–15.0)
MCH: 29.4 pg (ref 26.0–34.0)
MCHC: 32.5 g/dL (ref 30.0–36.0)
MCV: 90.4 fL (ref 78.0–100.0)
Platelets: 250 10*3/uL (ref 150–400)
RBC: 4.29 MIL/uL (ref 3.87–5.11)
RDW: 13.1 % (ref 11.5–15.5)
WBC: 10.2 10*3/uL (ref 4.0–10.5)

## 2016-12-28 LAB — BASIC METABOLIC PANEL
ANION GAP: 7 (ref 5–15)
BUN: 6 mg/dL (ref 6–20)
CALCIUM: 8.7 mg/dL — AB (ref 8.9–10.3)
CO2: 29 mmol/L (ref 22–32)
Chloride: 106 mmol/L (ref 101–111)
Creatinine, Ser: 0.93 mg/dL (ref 0.44–1.00)
GFR calc Af Amer: >60 mL/min (ref >60)
GLUCOSE: 97 mg/dL (ref 65–99)
Potassium: 3.6 mmol/L (ref 3.5–5.1)
Sodium: 142 mmol/L (ref 135–145)

## 2016-12-28 LAB — HIV ANTIBODY (ROUTINE TESTING W REFLEX): HIV SCREEN 4TH GENERATION: NONREACTIVE

## 2016-12-28 SURGERY — APPENDECTOMY, LAPAROSCOPIC
Anesthesia: General | Site: Abdomen

## 2016-12-28 MED ORDER — ROCURONIUM BROMIDE 50 MG/5ML IV SOSY
PREFILLED_SYRINGE | INTRAVENOUS | Status: AC
  Filled 2016-12-28: qty 5

## 2016-12-28 MED ORDER — PROMETHAZINE HCL 25 MG/ML IJ SOLN: 6.2500 mg | INTRAMUSCULAR | Status: DC | PRN

## 2016-12-28 MED ORDER — LACTATED RINGERS IR SOLN
Status: DC | PRN
  Administered 2016-12-28: 1000 mL

## 2016-12-28 MED ORDER — ACETAMINOPHEN 325 MG PO TABS
650.0000 mg | ORAL_TABLET | Freq: Four times a day (QID) | ORAL | Status: DC
  Administered 2016-12-28 – 2016-12-30 (×7): 650 mg via ORAL
  Filled 2016-12-28 (×7): qty 2

## 2016-12-28 MED ORDER — ACETAMINOPHEN 650 MG RE SUPP: 650.0000 mg | Freq: Four times a day (QID) | RECTAL | Status: DC

## 2016-12-28 MED ORDER — HYDROMORPHONE HCL-NACL 0.5-0.9 MG/ML-% IV SOSY
PREFILLED_SYRINGE | INTRAVENOUS | Status: AC
  Administered 2016-12-28: 0.5 mg
  Filled 2016-12-28: qty 2

## 2016-12-28 MED ORDER — PROMETHAZINE HCL 25 MG/ML IJ SOLN: 12.5000 mg | Freq: Four times a day (QID) | INTRAMUSCULAR | Status: DC | PRN

## 2016-12-28 MED ORDER — PROPOFOL 10 MG/ML IV BOLUS
INTRAVENOUS | Status: DC | PRN
  Administered 2016-12-28: 180 mg via INTRAVENOUS

## 2016-12-28 MED ORDER — LIDOCAINE 2% (20 MG/ML) 5 ML SYRINGE
INTRAMUSCULAR | Status: AC
  Filled 2016-12-28: qty 5

## 2016-12-28 MED ORDER — LACTATED RINGERS IV SOLN
INTRAVENOUS | Status: DC | PRN
  Administered 2016-12-28 (×2): via INTRAVENOUS

## 2016-12-28 MED ORDER — SUGAMMADEX SODIUM 200 MG/2ML IV SOLN
INTRAVENOUS | Status: AC
Start: 2016-12-28 — End: 2016-12-28
  Filled 2016-12-28: qty 2

## 2016-12-28 MED ORDER — HYDROMORPHONE HCL-NACL 0.5-0.9 MG/ML-% IV SOSY: 0.2500 mg | PREFILLED_SYRINGE | INTRAVENOUS | Status: DC | PRN

## 2016-12-28 MED ORDER — PROPOFOL 10 MG/ML IV BOLUS
INTRAVENOUS | Status: AC
Start: 2016-12-28 — End: 2016-12-28
  Filled 2016-12-28: qty 20

## 2016-12-28 MED ORDER — IBUPROFEN 400 MG PO TABS
600.0000 mg | ORAL_TABLET | Freq: Four times a day (QID) | ORAL | Status: DC
  Administered 2016-12-28 (×2): 600 mg via ORAL
  Filled 2016-12-28 (×3): qty 1

## 2016-12-28 MED ORDER — ROCURONIUM BROMIDE 10 MG/ML (PF) SYRINGE
PREFILLED_SYRINGE | INTRAVENOUS | Status: DC | PRN
  Administered 2016-12-28: 40 mg via INTRAVENOUS

## 2016-12-28 MED ORDER — MIDAZOLAM HCL 2 MG/2ML IJ SOLN
INTRAMUSCULAR | Status: AC
  Filled 2016-12-28: qty 2

## 2016-12-28 MED ORDER — SUGAMMADEX SODIUM 200 MG/2ML IV SOLN
INTRAVENOUS | Status: DC | PRN
  Administered 2016-12-28: 200 mg via INTRAVENOUS

## 2016-12-28 MED ORDER — 0.9 % SODIUM CHLORIDE (POUR BTL) OPTIME
TOPICAL | Status: DC | PRN
  Administered 2016-12-28: 1000 mL

## 2016-12-28 MED ORDER — DEXAMETHASONE SODIUM PHOSPHATE 10 MG/ML IJ SOLN
INTRAMUSCULAR | Status: AC
  Filled 2016-12-28: qty 1

## 2016-12-28 MED ORDER — SUCCINYLCHOLINE CHLORIDE 200 MG/10ML IV SOSY
PREFILLED_SYRINGE | INTRAVENOUS | Status: AC
  Filled 2016-12-28: qty 10

## 2016-12-28 MED ORDER — ONDANSETRON HCL 4 MG/2ML IJ SOLN
INTRAMUSCULAR | Status: AC
  Filled 2016-12-28: qty 2

## 2016-12-28 MED ORDER — DEXAMETHASONE SODIUM PHOSPHATE 10 MG/ML IJ SOLN
INTRAMUSCULAR | Status: DC | PRN
  Administered 2016-12-28: 10 mg via INTRAVENOUS

## 2016-12-28 MED ORDER — ONDANSETRON HCL 4 MG/2ML IJ SOLN
INTRAMUSCULAR | Status: DC | PRN
  Administered 2016-12-28: 4 mg via INTRAVENOUS

## 2016-12-28 MED ORDER — SCOPOLAMINE 1 MG/3DAYS TD PT72
MEDICATED_PATCH | TRANSDERMAL | Status: AC
  Filled 2016-12-28: qty 1

## 2016-12-28 MED ORDER — BUPIVACAINE-EPINEPHRINE (PF) 0.25% -1:200000 IJ SOLN
INTRAMUSCULAR | Status: AC
  Filled 2016-12-28: qty 30

## 2016-12-28 MED ORDER — FENTANYL CITRATE (PF) 250 MCG/5ML IJ SOLN
INTRAMUSCULAR | Status: AC
  Filled 2016-12-28: qty 5

## 2016-12-28 MED ORDER — KETOROLAC TROMETHAMINE 30 MG/ML IJ SOLN: 30.0000 mg | Freq: Once | INTRAMUSCULAR | Status: DC | PRN

## 2016-12-28 MED ORDER — FENTANYL CITRATE (PF) 250 MCG/5ML IJ SOLN
INTRAMUSCULAR | Status: DC | PRN
  Administered 2016-12-28: 100 ug via INTRAVENOUS
  Administered 2016-12-28 (×3): 50 ug via INTRAVENOUS

## 2016-12-28 MED ORDER — SUCCINYLCHOLINE CHLORIDE 200 MG/10ML IV SOSY
PREFILLED_SYRINGE | INTRAVENOUS | Status: DC | PRN
  Administered 2016-12-28: 120 mg via INTRAVENOUS

## 2016-12-28 MED ORDER — HYDROMORPHONE HCL-NACL 0.5-0.9 MG/ML-% IV SOSY
0.2500 mg | PREFILLED_SYRINGE | INTRAVENOUS | Status: DC | PRN
  Administered 2016-12-28 (×2): 0.5 mg via INTRAVENOUS

## 2016-12-28 MED ORDER — OXYCODONE HCL 5 MG PO TABS
5.0000 mg | ORAL_TABLET | ORAL | Status: DC | PRN
  Administered 2016-12-28 – 2016-12-29 (×3): 5 mg via ORAL
  Filled 2016-12-28 (×3): qty 1

## 2016-12-28 MED ORDER — LIDOCAINE 2% (20 MG/ML) 5 ML SYRINGE
INTRAMUSCULAR | Status: DC | PRN
  Administered 2016-12-28: 100 mg via INTRAVENOUS

## 2016-12-28 MED ORDER — BUPIVACAINE-EPINEPHRINE 0.25% -1:200000 IJ SOLN
INTRAMUSCULAR | Status: DC | PRN
  Administered 2016-12-28: 30 mL

## 2016-12-28 MED ORDER — MIDAZOLAM HCL 5 MG/5ML IJ SOLN
INTRAMUSCULAR | Status: DC | PRN
  Administered 2016-12-28: 2 mg via INTRAVENOUS

## 2016-12-28 MED ORDER — SCOPOLAMINE 1 MG/3DAYS TD PT72
1.0000 | MEDICATED_PATCH | TRANSDERMAL | Status: DC
  Administered 2016-12-28: 1 via TRANSDERMAL

## 2016-12-28 SURGICAL SUPPLY — 44 items
APPLIER CLIP 5 13 M/L LIGAMAX5 (MISCELLANEOUS)
APPLIER CLIP ROT 10 11.4 M/L (STAPLE)
CABLE HIGH FREQUENCY MONO STRZ (ELECTRODE) ×2 IMPLANT
CHLORAPREP W/TINT 26ML (MISCELLANEOUS) ×2 IMPLANT
CLIP APPLIE 5 13 M/L LIGAMAX5 (MISCELLANEOUS) IMPLANT
CLIP APPLIE ROT 10 11.4 M/L (STAPLE) IMPLANT
COVER SURGICAL LIGHT HANDLE (MISCELLANEOUS) ×2 IMPLANT
CUTTER FLEX LINEAR 45M (STAPLE) IMPLANT
DECANTER SPIKE VIAL GLASS SM (MISCELLANEOUS) IMPLANT
DERMABOND ADVANCED (GAUZE/BANDAGES/DRESSINGS) ×1
DERMABOND ADVANCED .7 DNX12 (GAUZE/BANDAGES/DRESSINGS) ×1 IMPLANT
DEVICE PMI PUNCTURE CLOSURE (MISCELLANEOUS) ×2 IMPLANT
DRAIN CHANNEL 19F RND (DRAIN) IMPLANT
ELECT REM PT RETURN 15FT ADLT (MISCELLANEOUS) ×2 IMPLANT
ENDOLOOP SUT PDS II  0 18 (SUTURE)
ENDOLOOP SUT PDS II 0 18 (SUTURE) IMPLANT
EVACUATOR SILICONE 100CC (DRAIN) IMPLANT
GLOVE BIO SURGEON STRL SZ 6 (GLOVE) ×2 IMPLANT
GLOVE INDICATOR 6.5 STRL GRN (GLOVE) ×2 IMPLANT
GOWN STRL REUS W/ TWL LRG LVL3 (GOWN DISPOSABLE) ×1 IMPLANT
GOWN STRL REUS W/TWL LRG LVL3 (GOWN DISPOSABLE) ×1
GOWN STRL REUS W/TWL XL LVL3 (GOWN DISPOSABLE) ×2 IMPLANT
GRASPER SUT TROCAR 14GX15 (MISCELLANEOUS) IMPLANT
IRRIG SUCT STRYKERFLOW 2 WTIP (MISCELLANEOUS)
IRRIGATION SUCT STRKRFLW 2 WTP (MISCELLANEOUS) IMPLANT
KIT BASIN OR (CUSTOM PROCEDURE TRAY) ×2 IMPLANT
NEEDLE INSUFFLATION 14GA 120MM (NEEDLE) ×2 IMPLANT
POUCH SPECIMEN RETRIEVAL 10MM (ENDOMECHANICALS) ×2 IMPLANT
RELOAD 45 VASCULAR/THIN (ENDOMECHANICALS) IMPLANT
RELOAD STAPLE TA45 3.5 REG BLU (ENDOMECHANICALS) ×2 IMPLANT
SCISSORS LAP 5X35 DISP (ENDOMECHANICALS) ×2 IMPLANT
SET IRRIG TUBING LAPAROSCOPIC (IRRIGATION / IRRIGATOR) ×2 IMPLANT
SHEARS HARMONIC ACE PLUS 36CM (ENDOMECHANICALS) IMPLANT
SLEEVE XCEL OPT CAN 5 100 (ENDOMECHANICALS) ×2 IMPLANT
SUT ETHILON 2 0 PS N (SUTURE) IMPLANT
SUT MNCRL AB 4-0 PS2 18 (SUTURE) ×2 IMPLANT
TOWEL OR 17X26 10 PK STRL BLUE (TOWEL DISPOSABLE) ×2 IMPLANT
TOWEL OR NON WOVEN STRL DISP B (DISPOSABLE) ×2 IMPLANT
TRAY FOLEY W/METER SILVER 14FR (SET/KITS/TRAYS/PACK) ×2 IMPLANT
TRAY FOLEY W/METER SILVER 16FR (SET/KITS/TRAYS/PACK) IMPLANT
TRAY LAPAROSCOPIC (CUSTOM PROCEDURE TRAY) ×2 IMPLANT
TROCAR BLADELESS OPT 5 100 (ENDOMECHANICALS) ×2 IMPLANT
TROCAR XCEL 12X100 BLDLESS (ENDOMECHANICALS) ×2 IMPLANT
TUBING INSUF HEATED (TUBING) ×2 IMPLANT

## 2016-12-28 NOTE — Progress Notes (Signed)
Day of Surgery   Subjective/Chief Complaint: Pain a little better. Nervous about surgery   Objective: Vital signs in last 24 hours: Temp:  [98 F (36.7 C)-98.9 F (37.2 C)] 98.1 F (36.7 C) (09/25 0612) Pulse Rate:  [54-83] 54 (09/25 0612) Resp:  [12-20] 12 (09/25 0612) BP: (91-122)/(49-79) 91/49 (09/25 0612) SpO2:  [100 %] 100 % (09/25 0612) Weight:  [90.7 kg (200 lb)] 90.7 kg (200 lb) (09/24 1509) Last BM Date: 12/26/16  Intake/Output from previous day: 09/24 0701 - 09/25 0700 In: 2536.7 [P.O.:180; I.V.:206.7; IV Piggyback:2150] Out: -  Intake/Output this shift: No intake/output data recorded.  General appearance: alert and cooperative GI: soft, RLQ tender Skin: Skin color, texture, turgor normal. No rashes or lesions  Lab Results:   Recent Labs  12/27/16 1656 12/28/16 0526  WBC 16.0* 10.2  HGB 13.7 12.6  HCT 41.5 38.8  PLT 269 250   BMET  Recent Labs  12/27/16 1656 12/28/16 0526  NA 139 142  K 3.7 3.6  CL 103 106  CO2 25 29  GLUCOSE 99 97  BUN 9 6  CREATININE 0.87 0.93  CALCIUM 9.3 8.7*   PT/INR No results for input(s): LABPROT, INR in the last 72 hours. ABG No results for input(s): PHART, HCO3 in the last 72 hours.  Invalid input(s): PCO2, PO2  Studies/Results: Ct Abdomen Pelvis W Contrast  Result Date: 12/27/2016 CLINICAL DATA:  Patient with vomiting.  Lower abdominal pain. EXAM: CT ABDOMEN AND PELVIS WITH CONTRAST TECHNIQUE: Multidetector CT imaging of the abdomen and pelvis was performed using the standard protocol following bolus administration of intravenous contrast. CONTRAST:  ISOVUE-300 IOPAMIDOL (ISOVUE-300) INJECTION 61% COMPARISON:  None. FINDINGS: Lower chest: Normal heart size. Lung bases are clear. No pleural effusion. Hepatobiliary: Liver is normal in size and contour. Fatty deposition adjacent to the falciform ligament. Gallbladder is unremarkable. No intrahepatic or extrahepatic biliary ductal dilatation. Pancreas:  Unremarkable Spleen: Unremarkable Adrenals/Urinary Tract: Normal adrenal glands. Kidneys are symmetric in size. No hydronephrosis. Urinary bladder is unremarkable. Stomach/Bowel: The appendix is thick walled and dilated within the right lower quadrant measuring up to 9 mm (image 56; series 5). No evidence for perforation. No definite appendicolith identified. No evidence for bowel obstruction. Normal morphology of the stomach. Vascular/Lymphatic: Normal caliber abdominal aorta. No retroperitoneal lymphadenopathy. Reproductive: Intrauterine device is present. Adnexal structures are unremarkable. Small amount of free fluid in the pelvis. Other: None. Musculoskeletal: No aggressive or acute appearing osseous lesions. IMPRESSION: Findings compatible with acute non perforated appendicitis. Electronically Signed   By: Annia Belt M.D.   On: 12/27/2016 20:51    Anti-infectives: Anti-infectives    Start     Dose/Rate Route Frequency Ordered Stop   12/28/16 0000  [MAR Hold]  cefTRIAXone (ROCEPHIN) 2 g in dextrose 5 % 50 mL IVPB     (MAR Hold since 12/28/16 0700)   2 g 100 mL/hr over 30 Minutes Intravenous Daily at bedtime 12/27/16 2350     12/28/16 0000  [MAR Hold]  metroNIDAZOLE (FLAGYL) IVPB 500 mg     (MAR Hold since 12/28/16 0700)   500 mg 100 mL/hr over 60 Minutes Intravenous Every 8 hours 12/27/16 2350     12/27/16 2115  piperacillin-tazobactam (ZOSYN) IVPB 3.375 g     3.375 g 100 mL/hr over 30 Minutes Intravenous  Once 12/27/16 2111 12/27/16 2212      Assessment/Plan: s/p Procedure(s): APPENDECTOMY LAPAROSCOPIC (N/A) To OR for lap appy. I have discussed the surgery with her including laparoscopic technique,  risk of infection, bleeding, pain, scarring, intraabdominal injury or abscess. She expressed understanding and agrees to proceed.   LOS: 1 day    Berna Bue 12/28/2016

## 2016-12-28 NOTE — Op Note (Signed)
Operative Report  Amanda Buck 21 y.o. female  [id number]  [id number]  12/28/2016  Surgeon: Berna Bue   Assistant: none  Procedure performed: Laparoscopic Appendectomy  Preop diagnosis: Acute appendicitis  Post-op diagnosis/intraop findings: Acute appendicitis  Specimens: appendix  EBL: minimal  Complications: none  Description of procedure: After obtaining informed consent the patient was brought to the operating room. Antibiotics and subcutaneous heparin were administered. SCD's were applied. General endotracheal anesthesia was initiated and a formal time-out was performed. The abdomen was prepped and draped in the usual sterile fashion and the abdomen was entered using an infraumbilical Veress needle and insufflated to 15 mmHg. A 5 mm trocar and camera were then introduced, the abdomen was inspected and there is no evidence of injury from our entry. A suprapubic 5 mm trocar and a left lower quadrant 12 mm trocar were introduced under direct visualization following infiltration with local. The patient was then placed in Trendelenburg and rotated to the left and the small bowel was reflected cephalad. The appendix was visualized: it was inflamed with some purulent rind but no free fluid or purulence was present. The appendix was partially retrocecal and the mesentery was fused to the ileal sail. A combination of blunt dissection and hook electrocautery were used to free it of its retroperitoneal attachments. Great care was taken to ensure no injury to surrounding retroperitoneal structures, cecum or terminal ileum. A window was created at the base of the appendix and a blue load linear cutting stapler was used to transect the appendix from the cecum. Hook cautery was used to dissect the mesentery from the appendix. The appendix was placed in an Endo Catch bag and removed through our 12 mm trocar. The abdomen was reinspected. Hemostasus was confirmed. The staple line appeared  intact and viable. The 12mm trocar site in the left lower quadrant was closed with a 0 vicryl in the fascia under direct visualization using a PMI device. The abdomen was desufflated and all trocars removed. The skin incisions were closed with running subcuticular monocryl and Dermabond. The patient was awakened, extubated and transported to the recovery room in stable condition.   All counts were correct at the completion of the case.

## 2016-12-28 NOTE — Anesthesia Procedure Notes (Signed)
Procedure Name: Intubation Date/Time: 12/28/2016 7:47 AM Performed by: Talbot Grumbling Pre-anesthesia Checklist: Patient identified, Emergency Drugs available, Suction available and Patient being monitored Patient Re-evaluated:Patient Re-evaluated prior to induction Oxygen Delivery Method: Circle system utilized Preoxygenation: Pre-oxygenation with 100% oxygen Induction Type: IV induction, Rapid sequence and Cricoid Pressure applied Laryngoscope Size: Mac and 3 Grade View: Grade I Tube type: Oral Tube size: 7.5 mm Number of attempts: 1 Airway Equipment and Method: Stylet Placement Confirmation: positive ETCO2 and breath sounds checked- equal and bilateral Secured at: 21 cm Tube secured with: Tape Dental Injury: Teeth and Oropharynx as per pre-operative assessment

## 2016-12-28 NOTE — Anesthesia Postprocedure Evaluation (Signed)
Anesthesia Post Note  Patient: Amanda Buck  Procedure(s) Performed: Procedure(s) (LRB): APPENDECTOMY LAPAROSCOPIC (N/A)     Patient location during evaluation: PACU Anesthesia Type: General Level of consciousness: sedated Pain management: pain level controlled Vital Signs Assessment: post-procedure vital signs reviewed and stable Respiratory status: spontaneous breathing and respiratory function stable Cardiovascular status: stable Postop Assessment: no apparent nausea or vomiting Anesthetic complications: no    Last Vitals:  Vitals:   12/28/16 0945 12/28/16 1000  BP: 113/64 107/68  Pulse: (!) 55 (!) 55  Resp: 12 10  Temp:  36.9 C  SpO2: 100% 100%    Last Pain:  Vitals:   12/28/16 1000  TempSrc:   PainSc: 3                  Adela Esteban DANIEL

## 2016-12-28 NOTE — Anesthesia Preprocedure Evaluation (Addendum)
Anesthesia Evaluation  Patient identified by MRN, date of birth, ID band Patient awake    Reviewed: Allergy & Precautions, NPO status , Patient's Chart, lab work & pertinent test results  History of Anesthesia Complications Negative for: history of anesthetic complications  Airway Mallampati: II  TM Distance: >3 FB Neck ROM: Full    Dental no notable dental hx. (+) Dental Advisory Given   Pulmonary neg pulmonary ROS,    Pulmonary exam normal breath sounds clear to auscultation       Cardiovascular negative cardio ROS Normal cardiovascular exam Rhythm:Regular Rate:Normal     Neuro/Psych negative neurological ROS  negative psych ROS   GI/Hepatic Neg liver ROS,   Endo/Other  negative endocrine ROS  Renal/GU negative Renal ROS  negative genitourinary   Musculoskeletal negative musculoskeletal ROS (+)   Abdominal   Peds negative pediatric ROS (+)  Hematology negative hematology ROS (+)   Anesthesia Other Findings   Reproductive/Obstetrics negative OB ROS                            Anesthesia Physical Anesthesia Plan  ASA: II  Anesthesia Plan: General   Post-op Pain Management:    Induction: Intravenous, Rapid sequence and Cricoid pressure planned  PONV Risk Score and Plan: 4 or greater and Ondansetron, Dexamethasone, Scopolamine patch - Pre-op and Diphenhydramine  Airway Management Planned: Oral ETT  Additional Equipment:   Intra-op Plan:   Post-operative Plan: Extubation in OR  Informed Consent: I have reviewed the patients History and Physical, chart, labs and discussed the procedure including the risks, benefits and alternatives for the proposed anesthesia with the patient or authorized representative who has indicated his/her understanding and acceptance.   Dental advisory given  Plan Discussed with: CRNA and Surgeon  Anesthesia Plan Comments:        Anesthesia  Quick Evaluation

## 2016-12-28 NOTE — Transfer of Care (Signed)
Immediate Anesthesia Transfer of Care Note  Patient: Amanda Buck  Procedure(s) Performed: Procedure(s): APPENDECTOMY LAPAROSCOPIC (N/A)  Patient Location: PACU  Anesthesia Type:General  Level of Consciousness:  sedated, patient cooperative and responds to stimulation  Airway & Oxygen Therapy:Patient Spontanous Breathing and Patient connected to face mask oxgen  Post-op Assessment:  Report given to PACU RN and Post -op Vital signs reviewed and stable  Post vital signs:  Reviewed and stable  Last Vitals:  Vitals:   12/27/16 2342 12/28/16 0612  BP: 122/79 (!) 91/49  Pulse: 76 (!) 54  Resp: 13 12  Temp: 36.7 C 36.7 C  SpO2: 100% 100%    Complications: No apparent anesthesia complications

## 2016-12-28 NOTE — Discharge Instructions (Signed)

## 2016-12-28 NOTE — Progress Notes (Signed)
OR transported pt via stretcher for scheduled surgery. Pt cell phone placed in bedside table in room per pt request.

## 2016-12-29 ENCOUNTER — Encounter (HOSPITAL_COMMUNITY): Payer: Self-pay | Admitting: Surgery

## 2016-12-29 LAB — BASIC METABOLIC PANEL
ANION GAP: 7 (ref 5–15)
BUN: 10 mg/dL (ref 6–20)
CHLORIDE: 106 mmol/L (ref 101–111)
CO2: 29 mmol/L (ref 22–32)
Calcium: 9 mg/dL (ref 8.9–10.3)
Creatinine, Ser: 0.94 mg/dL (ref 0.44–1.00)
GFR calc non Af Amer: >60 mL/min (ref >60)
Glucose, Bld: 105 mg/dL — ABNORMAL HIGH (ref 65–99)
POTASSIUM: 4.3 mmol/L (ref 3.5–5.1)
Sodium: 142 mmol/L (ref 135–145)

## 2016-12-29 LAB — CBC
HEMATOCRIT: 37.5 % (ref 36.0–46.0)
Hemoglobin: 11.9 g/dL — ABNORMAL LOW (ref 12.0–15.0)
MCH: 28.9 pg (ref 26.0–34.0)
MCHC: 31.7 g/dL (ref 30.0–36.0)
MCV: 91 fL (ref 78.0–100.0)
Platelets: 254 10*3/uL (ref 150–400)
RBC: 4.12 MIL/uL (ref 3.87–5.11)
RDW: 13.5 % (ref 11.5–15.5)
WBC: 11.2 10*3/uL — AB (ref 4.0–10.5)

## 2016-12-29 MED ORDER — POLYETHYLENE GLYCOL 3350 17 G PO PACK
17.0000 g | PACK | Freq: Every day | ORAL | Status: DC
  Administered 2016-12-29 – 2016-12-30 (×2): 17 g via ORAL
  Filled 2016-12-29 (×2): qty 1

## 2016-12-29 MED ORDER — METHOCARBAMOL 500 MG PO TABS
500.0000 mg | ORAL_TABLET | Freq: Three times a day (TID) | ORAL | Status: DC
  Administered 2016-12-29 – 2016-12-30 (×4): 500 mg via ORAL
  Filled 2016-12-29 (×4): qty 1

## 2016-12-29 MED ORDER — OXYCODONE HCL 5 MG PO TABS
5.0000 mg | ORAL_TABLET | ORAL | Status: DC | PRN
  Administered 2016-12-29 (×3): 5 mg via ORAL
  Filled 2016-12-29 (×3): qty 1

## 2016-12-29 MED ORDER — KETOROLAC TROMETHAMINE 15 MG/ML IJ SOLN
15.0000 mg | Freq: Four times a day (QID) | INTRAMUSCULAR | Status: DC
  Administered 2016-12-29 – 2016-12-30 (×4): 15 mg via INTRAVENOUS
  Filled 2016-12-29 (×4): qty 1

## 2016-12-29 MED ORDER — HYDROMORPHONE HCL-NACL 0.5-0.9 MG/ML-% IV SOSY: 0.5000 mg | PREFILLED_SYRINGE | INTRAVENOUS | Status: DC | PRN

## 2016-12-29 MED ORDER — MENTHOL 3 MG MT LOZG
1.0000 | LOZENGE | OROMUCOSAL | Status: DC | PRN
  Administered 2016-12-29: 3 mg via ORAL
  Filled 2016-12-29: qty 9

## 2016-12-29 MED ORDER — ENOXAPARIN SODIUM 40 MG/0.4ML ~~LOC~~ SOLN
40.0000 mg | SUBCUTANEOUS | Status: DC
  Administered 2016-12-29: 40 mg via SUBCUTANEOUS
  Filled 2016-12-29: qty 0.4

## 2016-12-29 NOTE — Progress Notes (Signed)
Patient ID: Amanda Buck, female   DOB: 10/01/1995, 21 y.o.   MRN: [id number]  [facility] Progress Note  1 Day Post-Op  Subjective: CC- s/p lap appy, n/v Patient states that she continues to have severe LLQ pain located around incision. It is constant, sharp, and severe. She is nauseated and vomited 3 times yesterday. She was able to tolerate [employer] for dinner. She is passing flatus. Nausea is worse when pain medication wears off.  Objective: Vital signs in last 24 hours: Temp:  [97.4 F (36.3 C)-98.7 F (37.1 C)] 97.4 F (36.3 C) (09/26 0526) Pulse Rate:  [54-99] 55 (09/26 0526) Resp:  [10-16] 14 (09/26 0526) BP: (107-133)/(58-79) 107/68 (09/26 0526) SpO2:  [98 %-100 %] 100 % (09/26 0526) Last BM Date: 12/26/16  Intake/Output from previous day: 09/25 0701 - 09/26 0700 In: 4773.3 [P.O.:1320; I.V.:3103.3; IV Piggyback:350] Out: 860 [Urine:850; Blood:10] Intake/Output this shift: No intake/output data recorded.  PE: Gen:  Alert, NAD, appears uncomfortable HEENT: EOM's intact, pupils equal and round Card:  RRR, no M/G/R heard Pulm:  CTAB, no W/R/R, effort normal Abd: Soft, ND, +BS, lap incisions C/D/I, TTP LLQ incision Ext:  No erythema, edema, or tenderness BUE/BLE  Psych: A&Ox3  Skin: no rashes noted, warm and dry  Lab Results:   Recent Labs  12/28/16 0526 12/29/16 0544  WBC 10.2 11.2*  HGB 12.6 11.9*  HCT 38.8 37.5  PLT 250 254   BMET  Recent Labs  12/28/16 0526 12/29/16 0544  NA 142 142  K 3.6 4.3  CL 106 106  CO2 29 29  GLUCOSE 97 105*  BUN 6 10  CREATININE 0.93 0.94  CALCIUM 8.7* 9.0   PT/INR No results for input(s): LABPROT, INR in the last 72 hours. CMP     Component Value Date/Time   NA 142 12/29/2016 0544   K 4.3 12/29/2016 0544   CL 106 12/29/2016 0544   CO2 29 12/29/2016 0544   GLUCOSE 105 (H) 12/29/2016 0544   BUN 10 12/29/2016 0544   CREATININE 0.94 12/29/2016 0544   CALCIUM 9.0 12/29/2016 0544   PROT 7.5  12/27/2016 1656   ALBUMIN 4.5 12/27/2016 1656   AST 23 12/27/2016 1656   ALT 22 12/27/2016 1656   ALKPHOS 66 12/27/2016 1656   BILITOT 1.0 12/27/2016 1656   GFRNONAA >60 12/29/2016 0544   GFRAA >60 12/29/2016 0544   Lipase     Component Value Date/Time   LIPASE 33 12/27/2016 1656       Studies/Results: Ct Abdomen Pelvis W Contrast  Result Date: 12/27/2016 CLINICAL DATA:  Patient with vomiting.  Lower abdominal pain. EXAM: CT ABDOMEN AND PELVIS WITH CONTRAST TECHNIQUE: Multidetector CT imaging of the abdomen and pelvis was performed using the standard protocol following bolus administration of intravenous contrast. CONTRAST:  ISOVUE-300 IOPAMIDOL (ISOVUE-300) INJECTION 61% COMPARISON:  None. FINDINGS: Lower chest: Normal heart size. Lung bases are clear. No pleural effusion. Hepatobiliary: Liver is normal in size and contour. Fatty deposition adjacent to the falciform ligament. Gallbladder is unremarkable. No intrahepatic or extrahepatic biliary ductal dilatation. Pancreas: Unremarkable Spleen: Unremarkable Adrenals/Urinary Tract: Normal adrenal glands. Kidneys are symmetric in size. No hydronephrosis. Urinary bladder is unremarkable. Stomach/Bowel: The appendix is thick walled and dilated within the right lower quadrant measuring up to 9 mm (image 56; series 5). No evidence for perforation. No definite appendicolith identified. No evidence for bowel obstruction. Normal morphology of the stomach. Vascular/Lymphatic: Normal caliber abdominal aorta. No retroperitoneal lymphadenopathy. Reproductive: Intrauterine device is  present. Adnexal structures are unremarkable. Small amount of free fluid in the pelvis. Other: None. Musculoskeletal: No aggressive or acute appearing osseous lesions. IMPRESSION: Findings compatible with acute non perforated appendicitis. Electronically Signed   By: Annia Belt M.D.   On: 12/27/2016 20:51    Anti-infectives: Anti-infectives    Start     Dose/Rate Route  Frequency Ordered Stop   12/28/16 0000  cefTRIAXone (ROCEPHIN) 2 g in dextrose 5 % 50 mL IVPB     2 g 100 mL/hr over 30 Minutes Intravenous Daily at bedtime 12/27/16 2350     12/28/16 0000  metroNIDAZOLE (FLAGYL) IVPB 500 mg     500 mg 100 mL/hr over 60 Minutes Intravenous Every 8 hours 12/27/16 2350     12/27/16 2115  piperacillin-tazobactam (ZOSYN) IVPB 3.375 g     3.375 g 100 mL/hr over 30 Minutes Intravenous  Once 12/27/16 2111 12/27/16 2212       Assessment/Plan Acute appendicitis S/p laparoscopic appendectomy 9/25 Dr. Fredricka Bonine - POD 1 - WBC slightly up 11.2, afebrile  ID - rocephin/flagyl 9/24>> FEN - IVF, regular diet VTE - SCDs, lovenox Foley - none Follow up - 2-3 weeks [facility] clinic  Plan - Postop n/v. Labs ok and VSS. Patient is passing gas and has good bowel sounds. Nausea may be related to pain. Will add IV toradol and robaxin for better pain control. Continue ice. Encourage ambulation. Will recheck patient later today.   LOS: 2 days    Franne Forts , [facility] 12/29/2016, 8:51 AM Pager: [phone] Consults: [phone] Mon-Fri 7:00 am-4:30 pm Sat-Sun 7:00 am-11:30 am

## 2016-12-30 LAB — NASOPHARYNGEAL CULTURE: Culture: NORMAL

## 2016-12-30 MED ORDER — OXYCODONE HCL 5 MG PO TABS: 5.0000 mg | ORAL_TABLET | ORAL | 0 refills | Status: DC | PRN

## 2016-12-30 MED ORDER — ONDANSETRON 4 MG PO TBDP: 4.0000 mg | ORAL_TABLET | Freq: Four times a day (QID) | ORAL | 0 refills | Status: DC | PRN

## 2016-12-30 MED ORDER — METHOCARBAMOL 500 MG PO TABS: 500.0000 mg | ORAL_TABLET | Freq: Three times a day (TID) | ORAL | 0 refills | Status: DC | PRN

## 2016-12-30 NOTE — Discharge Summary (Signed)
[facility] Discharge Summary   Patient ID: [name] MRN: [id number] DOB/AGE: 07/04/1995 21 y.o.  Admit date: 12/27/2016 Discharge date: 12/30/2016  Admitting Diagnosis: Acute appendicitis  Discharge Diagnosis Patient Active Problem List   Diagnosis Date Noted  . Appendicitis 12/27/2016  . Acute appendicitis 12/27/2016  . Postpartum care following vaginal delivery 11/30/2015    Consultants None Imaging: CT abdomen pelvis w contrast 12/27/16: Findings compatible with acute non perforated appendicitis.  Procedures Dr. Fredricka Bonine (12/28/16) - Laparoscopic Appendectomy  Hospital Course:  Amanda Buck is a 21yo female who presented to [facility]/24 with acute onset abdominal pain.  Workup showed acute appendicitis.  Patient was admitted and underwent procedure listed above.  Tolerated procedure well and was transferred to the floor.  Diet was advanced as tolerated.  Pain initially difficult to control but this gradually improved. On POD2, the patient was voiding well, tolerating diet, ambulating well, pain well controlled, vital signs stable, incisions c/d/i and felt stable for discharge home.  Patient will follow up in our office in 2 weeks and knows to call with questions or concerns.    Unable to review the patients medication history on the [place] controlled substance database as the website was not working at time of discharge.  Physical Exam: Gen:  Alert, NAD, pleasant HEENT: EOM's intact, pupils equal and round Card:  RRR, no M/G/R heard Pulm:  CTAB, no W/R/R, effort normal Abd: Soft, ND, +BS, lap incisions C/D/I, mild TTP LLQ incision Ext:  No erythema, edema, or tenderness BUE/BLE  Psych: A&Ox3  Skin: no rashes noted, warm and dry   Allergies as of 12/30/2016   No Known Allergies     Medication List    TAKE these medications   methocarbamol 500 MG tablet Commonly known as:  ROBAXIN Take 1 tablet (500 mg total) by mouth every 8 (eight) hours as needed for  muscle spasms.   ondansetron 4 MG disintegrating tablet Commonly known as:  ZOFRAN-ODT Take 1 tablet (4 mg total) by mouth every 6 (six) hours as needed for nausea.   oxyCODONE 5 MG immediate release tablet Commonly known as:  Oxy IR/ROXICODONE Take 1-2 tablets (5-10 mg total) by mouth every 4 (four) hours as needed for moderate pain or severe pain.            Discharge Care Instructions        Start     Ordered   12/30/16 0000  ondansetron (ZOFRAN-ODT) 4 MG disintegrating tablet  Every 6 hours PRN     12/30/16 1028   12/30/16 0000  oxyCODONE (OXY IR/ROXICODONE) 5 MG immediate release tablet  Every 4 hours PRN     12/30/16 1028   12/30/16 0000  Discharge patient    Question Answer Comment  Discharge disposition 01-Home or Self Care   Discharge patient date 12/30/2016      12/30/16 1028   12/30/16 0000  methocarbamol (ROBAXIN) 500 MG tablet  Every 8 hours PRN     12/30/16 1030       Follow-up Information    [facility] Go on 01/11/2017.   Specialty:  General Surgery Why:  Your appointment is 01/11/17 at 11:00 am. Please arrive 30 minutes early to check in and fill out necessary paperwork. Contact information: [address] [place] [address] [phone]          Signed: Franne Forts, [facility] 12/30/2016, 10:30 AM Pager: [phone] Consults: [phone] Mon-Fri  7:00 am-4:30 pm Sat-Sun 7:00 am-11:30 am

## 2017-01-04 ENCOUNTER — Encounter (HOSPITAL_COMMUNITY): Payer: Self-pay | Admitting: Surgery

## 2017-02-25 ENCOUNTER — Other Ambulatory Visit: Payer: Self-pay

## 2017-02-25 ENCOUNTER — Emergency Department
Admission: EM | Admit: 2017-02-25 | Discharge: 2017-02-25 | Disposition: A | Discharge status: Home / Self Care | Payer: Self-pay | Attending: Emergency Medicine | Admitting: Emergency Medicine

## 2017-02-25 ENCOUNTER — Encounter: Payer: Self-pay | Admitting: Emergency Medicine

## 2017-02-25 DIAGNOSIS — Z79899 Other long term (current) drug therapy: Secondary | ICD-10-CM | POA: Insufficient documentation

## 2017-02-25 DIAGNOSIS — N390 Urinary tract infection, site not specified: Secondary | ICD-10-CM | POA: Insufficient documentation

## 2017-02-25 DIAGNOSIS — J45909 Unspecified asthma, uncomplicated: Secondary | ICD-10-CM | POA: Insufficient documentation

## 2017-02-25 LAB — URINALYSIS, COMPLETE (UACMP) WITH MICROSCOPIC
Bilirubin Urine: NEGATIVE
GLUCOSE, UA: NEGATIVE mg/dL
Ketones, ur: 5 mg/dL — AB
Nitrite: NEGATIVE
PH: 5 (ref 5.0–8.0)
Protein, ur: 100 mg/dL — AB
SPECIFIC GRAVITY, URINE: 1.014 (ref 1.005–1.030)

## 2017-02-25 LAB — CBC
HEMATOCRIT: 42.9 % (ref 35.0–47.0)
HEMOGLOBIN: 14.3 g/dL (ref 12.0–16.0)
MCH: 29.5 pg (ref 26.0–34.0)
MCHC: 33.2 g/dL (ref 32.0–36.0)
MCV: 88.9 fL (ref 80.0–100.0)
Platelets: 203 10*3/uL (ref 150–440)
RBC: 4.83 MIL/uL (ref 3.80–5.20)
RDW: 14.1 % (ref 11.5–14.5)
WBC: 15.8 10*3/uL — ABNORMAL HIGH (ref 3.6–11.0)

## 2017-02-25 LAB — COMPREHENSIVE METABOLIC PANEL
ALBUMIN: 4 g/dL (ref 3.5–5.0)
ALT: 20 U/L (ref 14–54)
ANION GAP: 13 (ref 5–15)
AST: 23 U/L (ref 15–41)
Alkaline Phosphatase: 75 U/L (ref 38–126)
BUN: 10 mg/dL (ref 6–20)
CHLORIDE: 99 mmol/L — AB (ref 101–111)
CO2: 24 mmol/L (ref 22–32)
Calcium: 9.1 mg/dL (ref 8.9–10.3)
Creatinine, Ser: 1.33 mg/dL — ABNORMAL HIGH (ref 0.44–1.00)
GFR calc Af Amer: >60 mL/min (ref >60)
GFR calc non Af Amer: 57 mL/min — ABNORMAL LOW (ref >60)
GLUCOSE: 124 mg/dL — AB (ref 65–99)
POTASSIUM: 3.8 mmol/L (ref 3.5–5.1)
SODIUM: 136 mmol/L (ref 135–145)
Total Bilirubin: 2.5 mg/dL — ABNORMAL HIGH (ref 0.3–1.2)
Total Protein: 8.4 g/dL — ABNORMAL HIGH (ref 6.5–8.1)

## 2017-02-25 LAB — LIPASE, BLOOD: Lipase: 23 U/L (ref 11–51)

## 2017-02-25 LAB — POCT PREGNANCY, URINE: PREG TEST UR: NEGATIVE

## 2017-02-25 MED ORDER — CEFTRIAXONE SODIUM IN DEXTROSE 20 MG/ML IV SOLN
1.0000 g | Freq: Once | INTRAVENOUS | Status: AC
Start: 2017-02-25 — End: 2017-02-25
  Administered 2017-02-25: 1 g via INTRAVENOUS

## 2017-02-25 MED ORDER — KETOROLAC TROMETHAMINE 30 MG/ML IJ SOLN
30.0000 mg | Freq: Once | INTRAMUSCULAR | Status: AC
Start: 2017-02-25 — End: 2017-02-25
  Administered 2017-02-25: 30 mg via INTRAVENOUS
  Filled 2017-02-25: qty 1

## 2017-02-25 MED ORDER — CEFTRIAXONE SODIUM IN DEXTROSE 20 MG/ML IV SOLN
INTRAVENOUS | Status: AC
  Administered 2017-02-25: 1 g via INTRAVENOUS
  Filled 2017-02-25: qty 50

## 2017-02-25 MED ORDER — DEXTROSE 5 % IV SOLN: 1.0000 g | Freq: Once | INTRAVENOUS | Status: DC

## 2017-02-25 MED ORDER — CIPROFLOXACIN HCL 500 MG PO TABS: 500.0000 mg | ORAL_TABLET | Freq: Two times a day (BID) | ORAL | 0 refills | Status: AC

## 2017-02-25 MED ORDER — SODIUM CHLORIDE 0.9 % IV SOLN
1000.0000 mL | Freq: Once | INTRAVENOUS | Status: AC
  Administered 2017-02-25: 1000 mL via INTRAVENOUS

## 2017-02-25 MED ORDER — ONDANSETRON HCL 4 MG/2ML IJ SOLN
4.0000 mg | Freq: Once | INTRAMUSCULAR | Status: AC
  Administered 2017-02-25: 4 mg via INTRAVENOUS
  Filled 2017-02-25: qty 2

## 2017-02-25 NOTE — ED Notes (Signed)
Pt notified urine sample is needed, pt verbalizes understanding

## 2017-02-25 NOTE — ED Provider Notes (Signed)
[facility] Emergency Department Provider Note   ____________________________________________    I have reviewed the triage vital signs and the nursing notes.   HISTORY  Chief Complaint Fever and Emesis     HPI Amanda Buck is a 21 y.o. female who presents with complaints of subjective fever, nausea and vomiting.  She also reports occasional dysuria.  In addition she reports myalgias.  Symptoms have been occurring over the last 5 days.  She has not taken anything for this.  Nothing seems to make it better, nothing seems to make it worse.  No sick contacts reported.  No recent travel.  No cough or shortness of breath  Past Medical History:  Diagnosis Date  . Anemia affecting pregnancy   . Asthma    No current treatment needed  . Headache(784.0)   . Obesity affecting pregnancy in third trimester, antepartum     Patient Active Problem List   Diagnosis Date Noted  . Appendicitis 12/27/2016  . Acute appendicitis 12/27/2016  . Postpartum care following vaginal delivery 11/30/2015    Past Surgical History:  Procedure Laterality Date  . LAPAROSCOPIC APPENDECTOMY N/A 12/28/2016   Procedure: APPENDECTOMY LAPAROSCOPIC;  Surgeon: Berna Bueonnor, Chelsea A, MD;  Location: [facility] ORS;  Service: General;  Laterality: N/A;  . NO PAST SURGERIES      Prior to Admission medications   Medication Sig Start Date End Date Taking? Authorizing Provider  ciprofloxacin (CIPRO) 500 MG tablet Take 1 tablet (500 mg total) by mouth 2 (two) times daily for 10 days. 02/25/17 03/07/17  Jene EveryKinner, Jorgina Binning, MD  methocarbamol (ROBAXIN) 500 MG tablet Take 1 tablet (500 mg total) by mouth every 8 (eight) hours as needed for muscle spasms. 12/30/16   Meuth, Brooke A, PA-C  ondansetron (ZOFRAN-ODT) 4 MG disintegrating tablet Take 1 tablet (4 mg total) by mouth every 6 (six) hours as needed for nausea. 12/30/16   Meuth, Brooke A, PA-C  oxyCODONE (OXY IR/ROXICODONE) 5 MG immediate release tablet Take  1-2 tablets (5-10 mg total) by mouth every 4 (four) hours as needed for moderate pain or severe pain. 12/30/16   Meuth, Lina SarBrooke A, PA-C     Allergies Patient has no known allergies.  Family History  Problem Relation Age of Onset  . ALS Paternal Grandmother        Died at 1160  . Heart failure Paternal Grandfather        Died at 5660    Social History Social History   Tobacco Use  . Smoking status: Never Smoker  . Smokeless tobacco: Never Used  Substance Use Topics  . Alcohol use: No  . Drug use: No    Review of Systems  Constitutional: No fever/chills Eyes: No visual changes.  ENT: No sore throat. Cardiovascular: Denies chest pain. Respiratory: Denies shortness of breath. Gastrointestinal: No abdominal pain.  No nausea, no vomiting.   Genitourinary: Mild dysuria. Musculoskeletal: No back pain Skin: Negative for rash. Neurological: Negative for headaches    ____________________________________________   PHYSICAL EXAM:  VITAL SIGNS: ED Triage Vitals [02/25/17 1825]  Enc Vitals Group     BP 109/61     Pulse Rate 97     Resp 16     Temp 98.6 F (37 C)     Temp Source Oral     SpO2 100 %     Weight      Height      Head Circumference      Peak Flow  Pain Score 7     Pain Loc      Pain Edu?      Excl. in GC?     Constitutional: Alert and oriented. No acute distress. Pleasant and interactive Eyes: Conjunctivae are normal.   Nose: No congestion/rhinnorhea. Mouth/Throat: Mucous membranes are moist.    Cardiovascular: Normal rate, regular rhythm. Grossly normal heart sounds.  Good peripheral circulation. Respiratory: Normal respiratory effort.  No retractions. Lungs CTAB. Gastrointestinal: Soft and nontender. No distention.  No CVA tenderness Genitourinary: deferred Musculoskeletal: No lower extremity tenderness nor edema.  Warm and well perfused Neurologic:  Normal speech and language. No gross focal neurologic deficits are appreciated.  Skin:  Skin is  warm, dry and intact. No rash noted. Psychiatric: Mood and affect are normal. Speech and behavior are normal.  ____________________________________________   LABS (all labs ordered are listed, but only abnormal results are displayed)  Labs Reviewed  COMPREHENSIVE METABOLIC PANEL - Abnormal; Notable for the following components:      Result Value   Chloride 99 (*)    Glucose, Bld 124 (*)    Creatinine, Ser 1.33 (*)    Total Protein 8.4 (*)    Total Bilirubin 2.5 (*)    GFR calc non Af Amer 57 (*)    All other components within normal limits  CBC - Abnormal; Notable for the following components:   WBC 15.8 (*)    All other components within normal limits  URINALYSIS, COMPLETE (UACMP) WITH MICROSCOPIC - Abnormal; Notable for the following components:   Color, Urine AMBER (*)    APPearance CLOUDY (*)    Hgb urine dipstick MODERATE (*)    Ketones, ur 5 (*)    Protein, ur 100 (*)    Leukocytes, UA LARGE (*)    Bacteria, UA MANY (*)    Squamous Epithelial / LPF 6-30 (*)    All other components within normal limits  LIPASE, BLOOD  POC URINE PREG, ED  POCT PREGNANCY, URINE   ____________________________________________  EKG  None ____________________________________________  RADIOLOGY  None ____________________________________________   PROCEDURES  Procedure(s) performed: No  Procedures   Critical Care performed: No ____________________________________________   INITIAL IMPRESSION / ASSESSMENT AND PLAN / ED COURSE  Pertinent labs & imaging results that were available during my care of the patient were reviewed by me and considered in my medical decision making (see chart for details).  Differential diagnosis includes, viral illness, urinary tract infection, viral gastritis  Patient presents with multiple complaints as detailed above, lab work is significant for elevated white blood cell count and urinalysis is consistent with urinary tract infection, we will  treat the patient with IV Rocephin IV fluids and reevaluate  After antibiotics and fluids the patient reports feeling significantly better.  I will discharge her with antibiotics and close follow-up with her PCP.  She is to return if any change in her symptoms.    ____________________________________________   FINAL CLINICAL IMPRESSION(S) / ED DIAGNOSES  Final diagnoses:  Lower urinary tract infectious disease        Note:  This document was prepared using Dragon voice recognition software and may include unintentional dictation errors.    Jene EveryKinner, Renada Cronin, MD 02/25/17 2158

## 2017-02-25 NOTE — ED Triage Notes (Signed)
Pt states that she has had fever, cold symptoms, and vomiting for the past 5 days. Pt states that she is not able to keep food or fluids down. Pt states that she had not taken her temperature but she has felt hot and thinks her fever has been as high as 103. Pt afebrile in triage. Pt in NAD at this time.

## 2017-02-25 NOTE — ED Notes (Signed)
Reviewed d/c instructions, follow-up care, and prescription with patient. Patient verbalized understanding.  

## 2017-07-15 IMAGING — US US ABDOMEN LIMITED
1 series · 14 of 25 positions shown · non-contrast
Comparison: None.

CLINICAL DATA: Initial evaluation for acute abdominal pain.

EXAM:
US ABDOMEN LIMITED - RIGHT UPPER QUADRANT

[Series 1: us abdomen limited · 0.25mm/px · 14 of 38 slices shown]
[im 1/38]
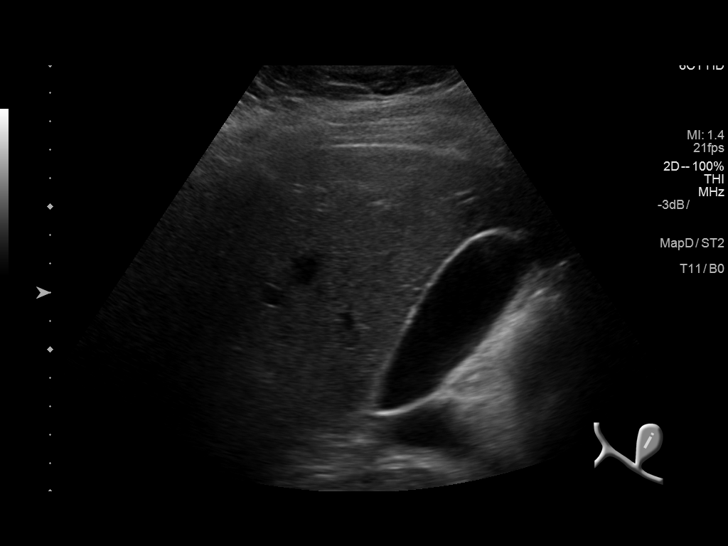
[im 4/38]
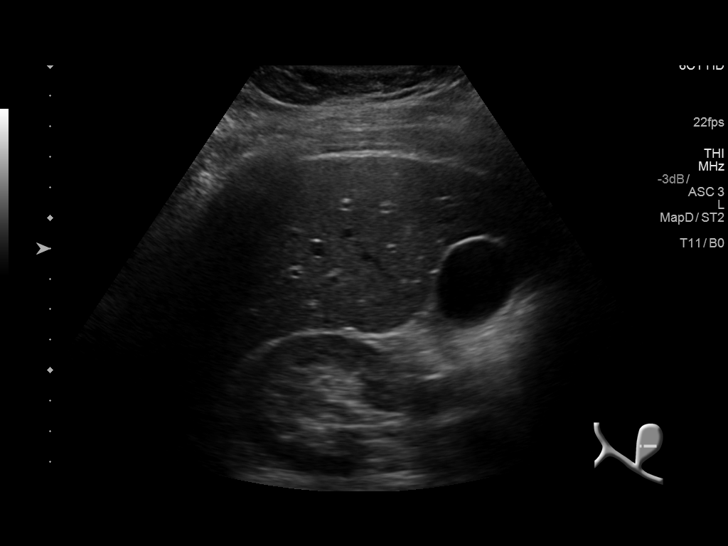
[im 7/38]
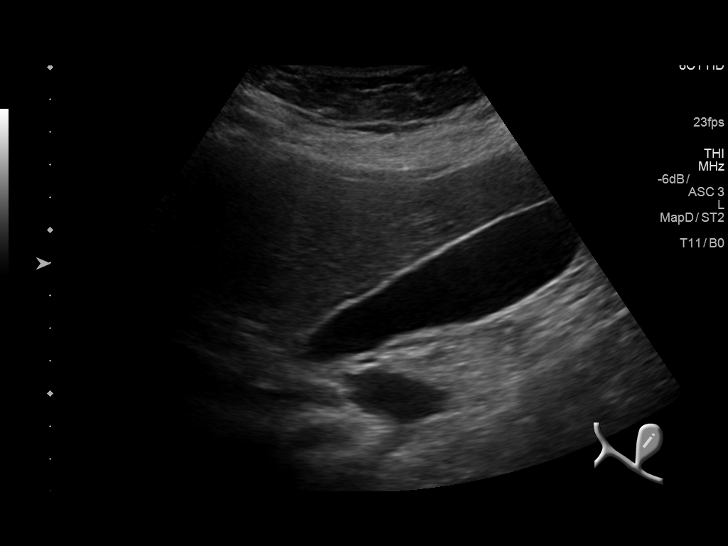
[im 10/38]
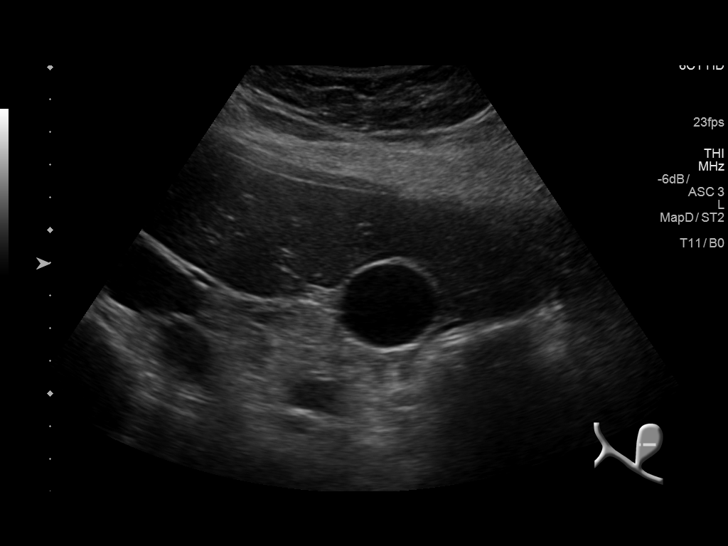
[im 13/38]
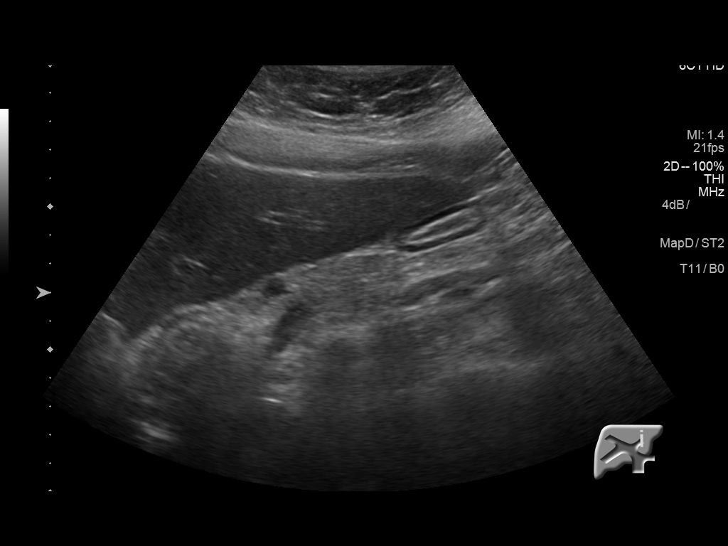
[im 14/38]
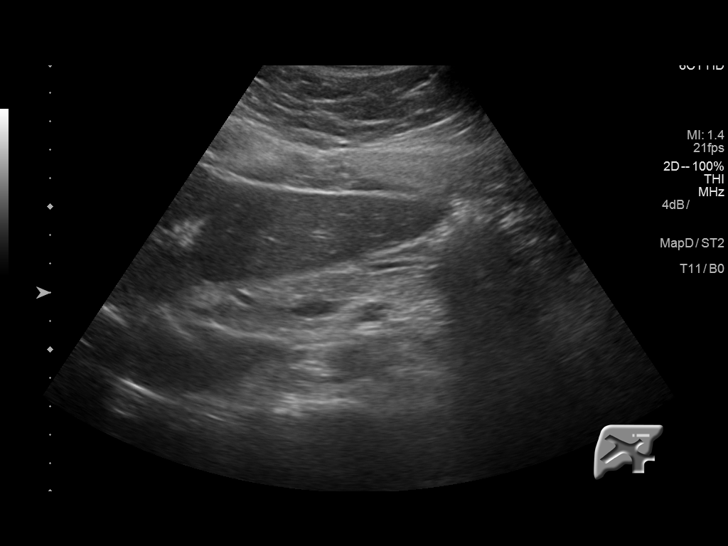
[im 17/38]
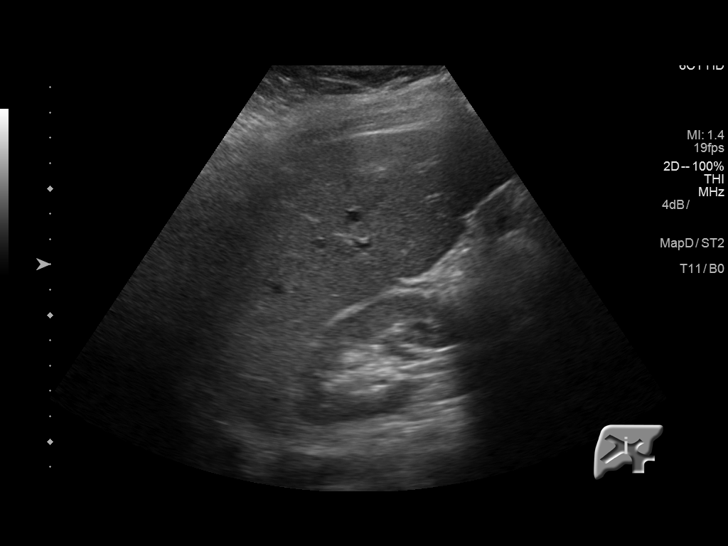
[im 21/38]
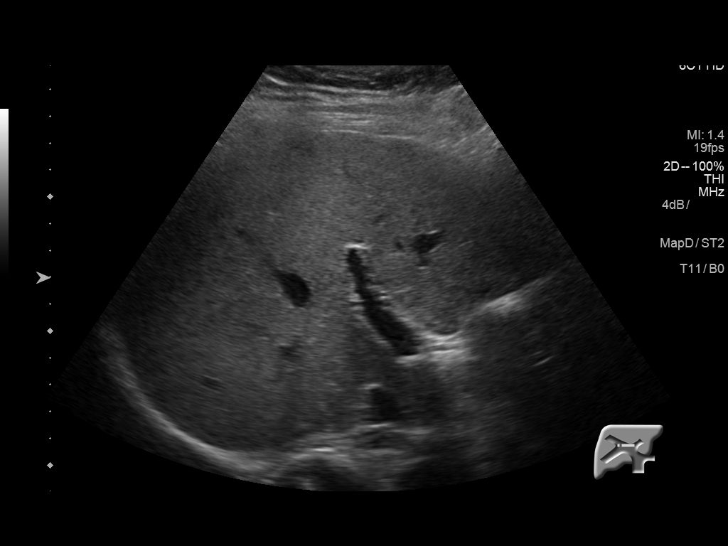
[im 24/38]
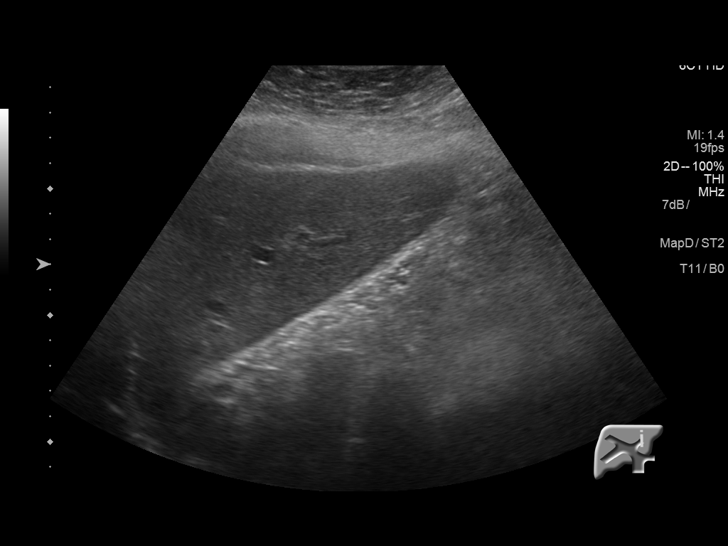
[im 25/38]
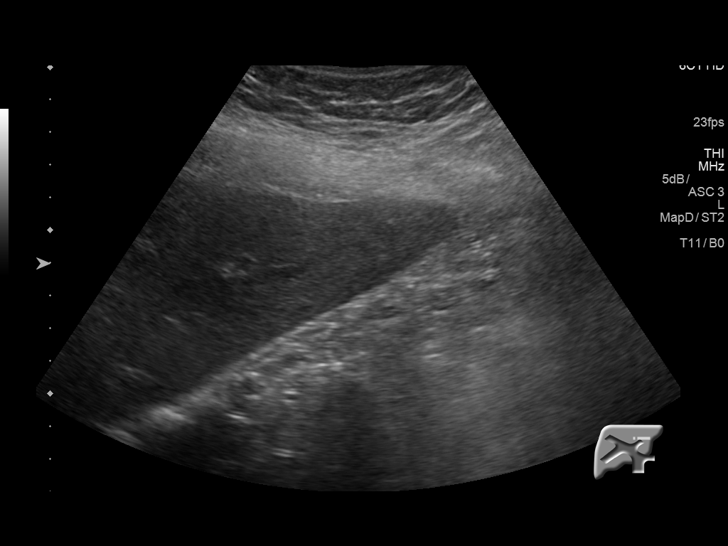
[im 28/38]
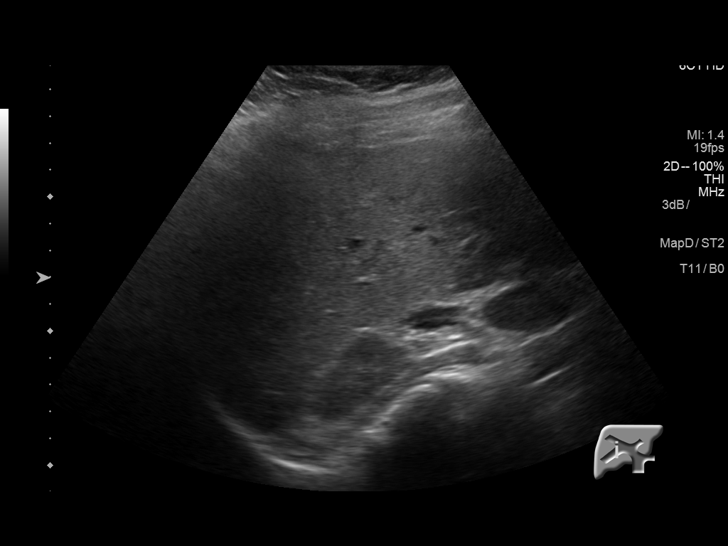
[im 31/38]
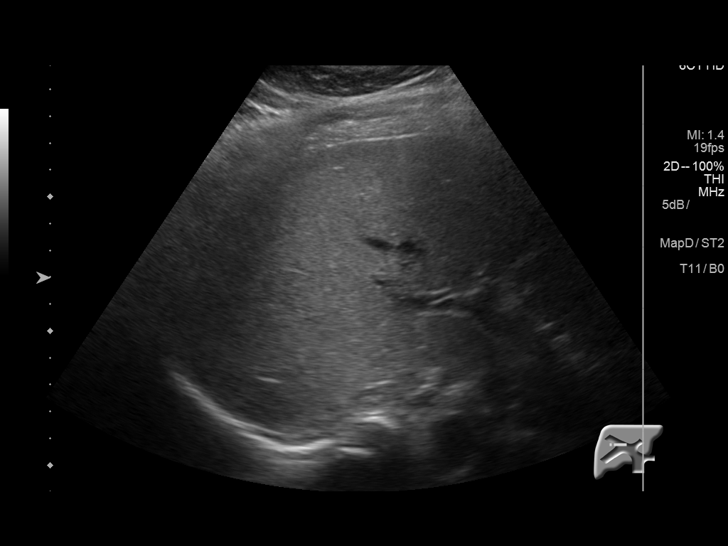
[im 34/38]
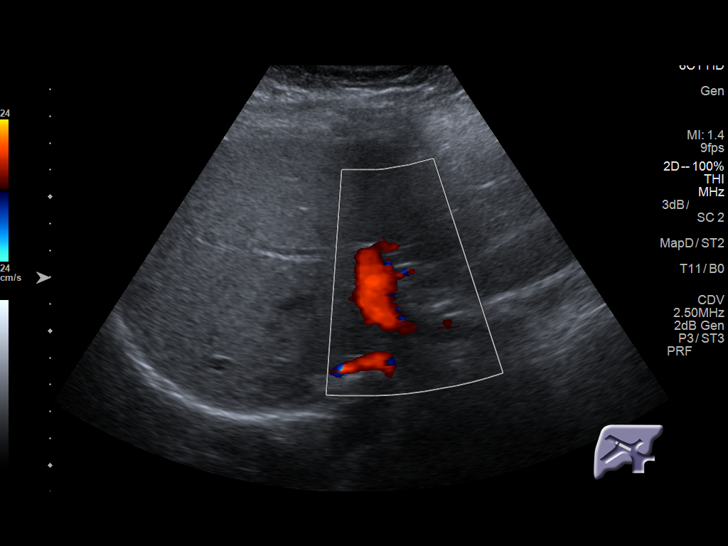
[im 38/38]
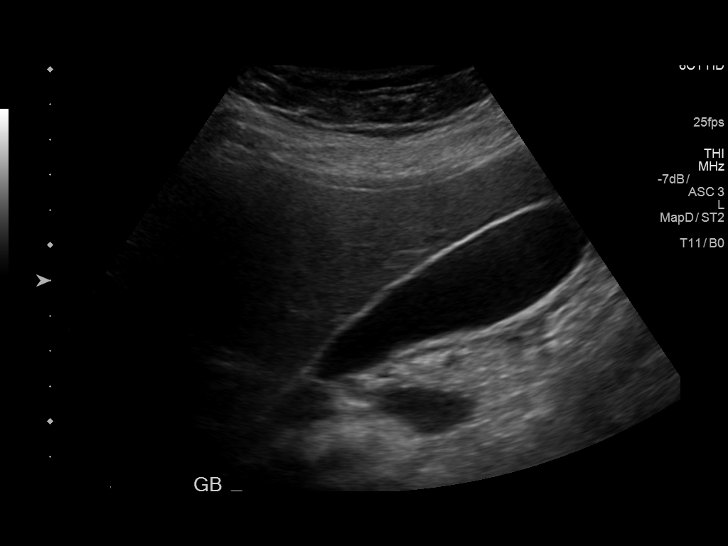

[14 of 25 positions shown; findings below may reference images not displayed]

FINDINGS: Gallbladder:

No gallstones or wall thickening visualized. No sonographic Murphy
sign noted by sonographer.

Common bile duct:

Diameter: 2.3 mm

Liver:

No focal lesion identified. Within normal limits in parenchymal
echogenicity.
IMPRESSION: Normal right upper quadrant ultrasound.

## 2018-05-30 NOTE — Progress Notes (Signed)
Gynecology Annual Exam  PCP: Dorann Lodge, MD  Chief Complaint:  Chief Complaint  Patient presents with  . Gynecologic Exam  . Urinary Tract Infection    Frequency w/low output/dysuria    History of Present Illness: Amanda Buck is a 23 y.o. G92P1001 wf who  presents for her annual exam and first Pap smear. The patient complains of dysuria, voiding in small amounts and urinary frequency x 2 weeks. She also has been having morning nausea for about the same amount of time.  Her menses are irregular, they occur every month or two, and they last 1-4 days. Her flow is light.  Her last menstrual period was earlier this month. She has intermittent  lower abdominal cramping, not necessarily accompanying her bleeding. Last pap smear: NA   The patient is sexually active. She currently uses the Mirena for contraception. It was inserted 01/12/2016. She does not have dyspareunia.  Since her last visit when she had her IUD inserted, she has had a laparoscopic appendectomy 12/2016  Her past medical history is remarkable for asthma, headache, and obesity  The patient does not perform self breast exams. Her last mammogram was NA    There is no family history of breast cancer.   There is no family history of ovarian cancer.   The patient denies smoking.  She denies drinking.   She denies illegal drug use.  The patient just recently started doing 30 min of exercise a couple times a week..  The patient denies current symptoms of depression.    Review of Systems: Review of Systems  Constitutional: Negative for chills, fever, malaise/fatigue and weight loss.  HENT: Negative for congestion, sinus pain and sore throat.   Eyes: Negative for blurred vision and pain.  Respiratory: Negative for hemoptysis, shortness of breath and wheezing.   Cardiovascular: Negative for chest pain, palpitations and leg swelling.  Gastrointestinal: Positive for nausea (in the AM x 2 week). Negative for  abdominal pain, blood in stool, diarrhea, heartburn and vomiting.  Genitourinary: Positive for dysuria and frequency. Negative for hematuria and urgency.       Positive for voiding in small amounts and vaginal discharge  Musculoskeletal: Negative for back pain, joint pain and myalgias.  Skin: Negative for itching and rash.  Neurological: Negative for dizziness, tingling and headaches.  Endo/Heme/Allergies: Negative for environmental allergies and polydipsia. Does not bruise/bleed easily.       Negative for hirsutism   Psychiatric/Behavioral: Negative for depression. The patient is not nervous/anxious and does not have insomnia.     Past Medical History:  Past Medical History:  Diagnosis Date  . Anemia affecting pregnancy   . Asthma    No current treatment needed  . Headache(784.0)   . Obesity (BMI 35.0-39.9 without comorbidity) 05/31/2018   BMI 38.28 kg/m2  . Obesity affecting pregnancy in third trimester, antepartum     Past Surgical History:  Past Surgical History:  Procedure Laterality Date  . LAPAROSCOPIC APPENDECTOMY N/A 12/28/2016   Procedure: APPENDECTOMY LAPAROSCOPIC;  Surgeon: Berna Bue, MD;  Location: [facility];  Service: General;  Laterality: N/A;    Family History:  Family History  Problem Relation Age of Onset  . ALS Paternal Grandmother        Died at 92  . Heart failure Paternal Grandfather        Died at 70    Social History:  Social History   Socioeconomic History  . Marital status: Single  Spouse name: Not on file  . Number of children: 1  . Years of education: Not on file  . Highest education level: Not on file  Occupational History  . Occupation: Nutritional therapist: [employer]  Social Needs  . Financial resource strain: Not on file  . Food insecurity:    Worry: Not on file    Inability: Not on file  . Transportation needs:    Medical: Not on file    Non-medical: Not on file  Tobacco Use  . Smoking status: Never Smoker    . Smokeless tobacco: Never Used  Substance and Sexual Activity  . Alcohol use: No  . Drug use: No  . Sexual activity: Yes    Partners: Male    Birth control/protection: I.U.D.  Lifestyle  . Physical activity:    Days per week: 2 days    Minutes per session: 30 min  . Stress: Not on file  Relationships  . Social connections:    Talks on phone: Not on file    Gets together: Not on file    Attends religious service: Not on file    Active member of club or organization: Not on file    Attends meetings of clubs or organizations: Not on file    Relationship status: Not on file  . Intimate partner violence:    Fear of current or ex partner: Not on file    Emotionally abused: Not on file    Physically abused: Not on file    Forced sexual activity: Not on file  Other Topics Concern  . Not on file  Social History Narrative  . Not on file    Allergies:  No Known Allergies  Medications:none     Physical Exam Vitals: BP 110/60 (BP Location: Right Arm, Patient Position: Sitting, Cuff Size: Normal)   Pulse 73   Ht 5\' 4"  (1.626 m)   Wt 223 lb (101.2 kg)   BMI 38.28 kg/m   General: WF in NAD HEENT: normocephalic, anicteric Neck: no thyroid enlargement, no palpable nodules, no cervical lymphadenopathy  Pulmonary: No increased work of breathing, CTAB Cardiovascular: RRR, without murmur  Breast: Breast symmetrical, no tenderness, no palpable nodules or masses, no skin or nipple retraction present, no nipple discharge.  No axillary, infraclavicular or supraclavicular lymphadenopathy. Reviewed SBE Abdomen: Soft, non-tender, non-distended.  Umbilicus without lesions.  No hepatomegaly or masses palpable. No evidence of hernia. Genitourinary:  External: Normal external female genitalia.  Normal urethral meatus, normal  Bartholin's and Skene's glands.    Vagina: Normal vaginal mucosa, no evidence of prolapse, white mucoepithelial discharge    Cervix: Grossly normal in appearance, no  bleeding, non-tender, IUD strings present  Uterus: Anteverted, normal size, shape, and consistency, mobile, and non-tender  Adnexa: No adnexal masses, non-tender  Rectal: deferred  Lymphatic: no evidence of inguinal lymphadenopathy Extremities: no edema, erythema, or tenderness Neurologic: Grossly intact Psychiatric: mood appropriate, affect full  Results for orders placed or performed in visit on 05/31/18 (from the past 24 hour(s))  POCT Urinalysis Dipstick     Status: Abnormal   Collection Time: 05/31/18  3:38 PM  Result Value Ref Range   Color, UA amber    Clarity, UA clear    Glucose, UA Negative Negative   Bilirubin, UA neg    Ketones, UA trace    Spec Grav, UA 1.025 1.010 - 1.025   Blood, UA neg    pH, UA 6.0 5.0 - 8.0   Protein,  UA Positive (A) Negative   Urobilinogen, UA 0.2 0.2 or 1.0 E.U./dL   Nitrite, UA neg    Leukocytes, UA Negative Negative   Appearance normal    Odor Normal   POCT urine pregnancy     Status: Normal   Collection Time: 05/31/18  4:06 PM  Result Value Ref Range   Preg Test, Ur Negative Negative  POCT Wet Prep [place])     Status: Normal   Collection Time: 05/31/18  8:49 PM  Result Value Ref Range   Source Wet Prep POC vaginal    WBC, Wet Prep HPF POC many    Bacteria Wet Prep HPF POC     BACTERIA WET PREP MORPHOLOGY POC     Clue Cells Wet Prep HPF POC None None   Clue Cells Wet Prep Whiff POC     Yeast Wet Prep HPF POC None None   KOH Wet Prep POC     Trichomonas Wet Prep HPF POC Absent Absent    Assessment: 23 y.o. G1P1001 annual gyn exam IUD in place Dysuria and urinary frequency R/O UTI  Urine culture sent  Bactrim DS BID x 3 days  Increase water intake  Plan:   1) Breast cancer screening - recommend monthly self breast exam.   2) STI screening was offered and accepted. Screening for GC and Chlamydia with Pap. RPR and HIV drawn  3) Cervical cancer screening - Pap was done.   4) Contraception - IUD expires 2022  5) will  call with urine culture result  6) RTO in 1 year and prn  Farrel Conners, CNM

## 2018-05-31 ENCOUNTER — Ambulatory Visit (INDEPENDENT_AMBULATORY_CARE_PROVIDER_SITE_OTHER): Payer: BLUE CROSS/BLUE SHIELD | Admitting: Certified Nurse Midwife

## 2018-05-31 ENCOUNTER — Encounter: Payer: Self-pay | Admitting: Certified Nurse Midwife

## 2018-05-31 ENCOUNTER — Other Ambulatory Visit: Payer: Self-pay

## 2018-05-31 ENCOUNTER — Other Ambulatory Visit (HOSPITAL_COMMUNITY)
Admission: RE | Admit: 2018-05-31 | Discharge: 2018-05-31 | Disposition: A | Discharge status: Home / Self Care | Payer: BLUE CROSS/BLUE SHIELD | Source: Ambulatory Visit | Attending: Certified Nurse Midwife | Admitting: Certified Nurse Midwife

## 2018-05-31 VITALS — BP 110/60 | HR 73 | Ht 64.0 in | Wt 223.0 lb

## 2018-05-31 DIAGNOSIS — Z01419 Encounter for gynecological examination (general) (routine) without abnormal findings: Secondary | ICD-10-CM

## 2018-05-31 DIAGNOSIS — R1030 Lower abdominal pain, unspecified: Secondary | ICD-10-CM

## 2018-05-31 DIAGNOSIS — Z113 Encounter for screening for infections with a predominantly sexual mode of transmission: Secondary | ICD-10-CM | POA: Insufficient documentation

## 2018-05-31 DIAGNOSIS — Z3202 Encounter for pregnancy test, result negative: Secondary | ICD-10-CM

## 2018-05-31 DIAGNOSIS — N898 Other specified noninflammatory disorders of vagina: Secondary | ICD-10-CM | POA: Diagnosis not present

## 2018-05-31 DIAGNOSIS — Z124 Encounter for screening for malignant neoplasm of cervix: Secondary | ICD-10-CM | POA: Diagnosis present

## 2018-05-31 DIAGNOSIS — R350 Frequency of micturition: Secondary | ICD-10-CM | POA: Diagnosis not present

## 2018-05-31 DIAGNOSIS — J45909 Unspecified asthma, uncomplicated: Secondary | ICD-10-CM | POA: Insufficient documentation

## 2018-05-31 DIAGNOSIS — E669 Obesity, unspecified: Secondary | ICD-10-CM | POA: Insufficient documentation

## 2018-05-31 DIAGNOSIS — R300 Dysuria: Secondary | ICD-10-CM

## 2018-05-31 HISTORY — DX: Obesity, unspecified: E66.9

## 2018-05-31 LAB — POCT URINALYSIS DIPSTICK
Appearance: NORMAL
BILIRUBIN UA: NEGATIVE
Blood, UA: NEGATIVE
Glucose, UA: NEGATIVE
Leukocytes, UA: NEGATIVE
Nitrite, UA: NEGATIVE
ODOR: NORMAL
PROTEIN UA: POSITIVE — AB
SPEC GRAV UA: 1.025 (ref 1.010–1.025)
Urobilinogen, UA: 0.2 E.U./dL
pH, UA: 6 (ref 5.0–8.0)

## 2018-05-31 LAB — POCT WET PREP (WET MOUNT): Trichomonas Wet Prep HPF POC: ABSENT

## 2018-05-31 LAB — POCT URINE PREGNANCY: Preg Test, Ur: NEGATIVE

## 2018-05-31 MED ORDER — PHENAZOPYRIDINE HCL 200 MG PO TABS: 200.0000 mg | ORAL_TABLET | Freq: Three times a day (TID) | ORAL | 0 refills | Status: DC | PRN

## 2018-05-31 MED ORDER — SULFAMETHOXAZOLE-TRIMETHOPRIM 800-160 MG PO TABS: 1.0000 | ORAL_TABLET | Freq: Two times a day (BID) | ORAL | 0 refills | Status: DC

## 2018-06-01 LAB — HIV ANTIBODY (ROUTINE TESTING W REFLEX): HIV Screen 4th Generation wRfx: NONREACTIVE

## 2018-06-01 LAB — RPR QUALITATIVE: RPR Ser Ql: NONREACTIVE

## 2018-06-01 LAB — URINE CULTURE

## 2018-06-02 LAB — CYTOLOGY - PAP
CHLAMYDIA, DNA PROBE: NEGATIVE
DIAGNOSIS: NEGATIVE
Neisseria Gonorrhea: NEGATIVE

## 2018-06-07 ENCOUNTER — Telehealth: Payer: Self-pay | Admitting: Certified Nurse Midwife

## 2018-06-07 ENCOUNTER — Other Ambulatory Visit: Payer: Self-pay | Admitting: Certified Nurse Midwife

## 2018-06-07 MED ORDER — TRIAMCINOLONE ACETONIDE 0.1 % EX OINT: 1.0000 "application " | TOPICAL_OINTMENT | Freq: Two times a day (BID) | CUTANEOUS | 0 refills | Status: DC | PRN

## 2018-06-07 NOTE — Telephone Encounter (Signed)
LAbs all normal. Still having some mild vulvar irritation after antibiotics. RX for Kenalog to pharmacy. Advised to change detergent back to what she used before.

## 2018-06-07 NOTE — Telephone Encounter (Signed)
Patient is calling for labs results. Please advise. 

## 2019-07-04 ENCOUNTER — Encounter: Payer: Self-pay | Admitting: Obstetrics and Gynecology

## 2019-07-04 ENCOUNTER — Other Ambulatory Visit: Payer: Self-pay

## 2019-07-04 ENCOUNTER — Ambulatory Visit (INDEPENDENT_AMBULATORY_CARE_PROVIDER_SITE_OTHER): Payer: BLUE CROSS/BLUE SHIELD | Admitting: Obstetrics and Gynecology

## 2019-07-04 ENCOUNTER — Other Ambulatory Visit (HOSPITAL_COMMUNITY)
Admission: RE | Admit: 2019-07-04 | Discharge: 2019-07-04 | Disposition: A | Discharge status: Home / Self Care | Payer: BLUE CROSS/BLUE SHIELD | Source: Ambulatory Visit | Attending: Obstetrics and Gynecology | Admitting: Obstetrics and Gynecology

## 2019-07-04 ENCOUNTER — Encounter: Payer: Self-pay | Admitting: Neurology

## 2019-07-04 VITALS — BP 108/64 | Ht 64.0 in | Wt 243.0 lb

## 2019-07-04 DIAGNOSIS — Z01419 Encounter for gynecological examination (general) (routine) without abnormal findings: Secondary | ICD-10-CM

## 2019-07-04 DIAGNOSIS — E669 Obesity, unspecified: Secondary | ICD-10-CM | POA: Diagnosis not present

## 2019-07-04 DIAGNOSIS — Z113 Encounter for screening for infections with a predominantly sexual mode of transmission: Secondary | ICD-10-CM

## 2019-07-04 DIAGNOSIS — Z789 Other specified health status: Secondary | ICD-10-CM

## 2019-07-04 DIAGNOSIS — G43519 Persistent migraine aura without cerebral infarction, intractable, without status migrainosus: Secondary | ICD-10-CM | POA: Diagnosis not present

## 2019-07-04 NOTE — Progress Notes (Signed)
Gynecology Annual Exam  PCP: Patient, No Pcp Per  Chief Complaint:  Chief Complaint  Patient presents with  . Gynecologic Exam    History of Present Illness: Patient is a 24 y.o. G1P1001 presents for annual exam. The patient has no complaints today.   LMP: No LMP recorded. (Menstrual status: IUD).  Heavy Menses: no Intermenstrual Bleeding: no Postcoital Bleeding: no Dysmenorrhea: no  The patient is sexually active. She currently uses IUD for contraception. She denies dyspareunia.  The patient does perform self breast exams.  There is no notable family history of breast or ovarian cancer in her family.  The patient wears seatbelts: no.  The patient has regular exercise: no.    The patient denies current symptoms of depression.   PHQ-9: 2 GAD-7: 9- reports social stressors regarding bills and taking care of her daughter. Has seen a therapist in the past and used to journal. Feels like she know what she needs to do to help herself and declines assistance at this time. Not interested in medication at this time.   She is concerned IUD may be contributing to weight gain. Discussed her lifestyle. 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 She generally returns home in the evening spending time with her daughter and doing household tasks. She does not exercise currently. She generally feels tired from a busy day.   She does not eat breakfast. She eats a frozen meal for lunch and then will have a bigger dinner with chicken or steak and mashed potatoes. She does drink soft drinks. Generally she drinks 4-5 soft drinks a day. She does snack. She reportst that if she does not drink the soft drinks she will have bad headaches. She reports that she has daily headaches which are made worse by light and sound. She was supposed to take daily medication and a as needed medication after she saw a neurologist as a teenager, but she has not taken those medications in several  years.   Review of Systems: Review of Systems  Constitutional: Negative for chills, fever, malaise/fatigue and weight loss.  HENT: Negative for congestion, hearing loss and sinus pain.   Eyes: Negative for blurred vision and double vision.  Respiratory: Negative for cough, sputum production, shortness of breath and wheezing.   Cardiovascular: Negative for chest pain, palpitations, orthopnea and leg swelling.  Gastrointestinal: Negative for abdominal pain, constipation, diarrhea, nausea and vomiting.  Genitourinary: Negative for dysuria, flank pain, frequency, hematuria and urgency.  Musculoskeletal: Negative for back pain, falls and joint pain.  Skin: Negative for itching and rash.  Neurological: Negative for dizziness and headaches.  Psychiatric/Behavioral: Negative for depression, substance abuse and suicidal ideas. The patient is not nervous/anxious.     Past Medical History:  Patient Active Problem List   Diagnosis Date Noted  . Obesity (BMI 30-39.9) 05/31/2018  . Asthma 05/31/2018  . Appendicitis 12/27/2016  . Acute appendicitis 12/27/2016    Past Surgical History:  Past Surgical History:  Procedure Laterality Date  . LAPAROSCOPIC APPENDECTOMY N/A 12/28/2016   Procedure: APPENDECTOMY LAPAROSCOPIC;  Surgeon: Berna Bue, MD;  Location: [facility];  Service: General;  Laterality: N/A;    Gynecologic History:  No LMP recorded. (Menstrual status: IUD). Contraception: IUD Last Pap: Results were: 2020- NIL    Obstetric History: G1P1001  Family History:  Family History  Problem Relation Age of Onset  . ALS Paternal Grandmother        Died at 78  .  Heart failure Paternal Grandfather        Died at 46    Social History:  Social History   Socioeconomic History  . Marital status: Single    Spouse name: Not on file  . Number of children: 1  . Years of education: Not on file  . Highest education level: Not on file  Occupational History  . Occupation: Biomedical scientist: [employer]  Tobacco Use  . Smoking status: Never Smoker  . Smokeless tobacco: Never Used  Substance and Sexual Activity  . Alcohol use: No  . Drug use: No  . Sexual activity: Yes    Partners: Male    Birth control/protection: I.U.D.    Comment: Mirena   Other Topics Concern  . Not on file  Social History Narrative  . Not on file   Social Determinants of Health   Financial Resource Strain:   . Difficulty of Paying Living Expenses:   Food Insecurity:   . Worried About Charity fundraiser in the Last Year:   . Arboriculturist in the Last Year:   Transportation Needs:   . Film/video editor (Medical):   Marland Kitchen Lack of Transportation (Non-Medical):   Physical Activity:   . Days of Exercise per Week:   . Minutes of Exercise per Session:   Stress:   . Feeling of Stress :   Social Connections:   . Frequency of Communication with Friends and Family:   . Frequency of Social Gatherings with Friends and Family:   . Attends Religious Services:   . Active Member of Clubs or Organizations:   . Attends Archivist Meetings:   Marland Kitchen Marital Status:   Intimate Partner Violence:   . Fear of Current or Ex-Partner:   . Emotionally Abused:   Marland Kitchen Physically Abused:   . Sexually Abused:     Allergies:  No Known Allergies  Medications: Prior to Admission medications   Not on File    Physical Exam Vitals: Blood pressure 108/64, height 5\' 4"  (1.626 m), weight 243 lb (110.2 kg), unknown if currently breastfeeding.  General: NAD HEENT: normocephalic, anicteric Thyroid: no enlargement, no palpable nodules Pulmonary: No increased work of breathing, CTAB Cardiovascular: RRR, distal pulses 2+ Breast: Breast symmetrical, no tenderness, no palpable nodules or masses, no skin or nipple retraction present, no nipple discharge.  No axillary or supraclavicular lymphadenopathy. Abdomen: NABS, soft, non-tender, non-distended.  Umbilicus without lesions.  No hepatomegaly,  splenomegaly or masses palpable. No evidence of hernia  Genitourinary:  External: Normal external female genitalia.  Normal urethral meatus, normal Bartholin's and Skene's glands.    Vagina: Normal vaginal mucosa, no evidence of prolapse.    Cervix: Grossly normal in appearance, no bleeding  Uterus: Non-enlarged, mobile, normal contour.  No CMT  Adnexa: ovaries non-enlarged, no adnexal masses  Rectal: deferred  Lymphatic: no evidence of inguinal lymphadenopathy Extremities: no edema, erythema, or tenderness Neurologic: Grossly intact Psychiatric: mood appropriate, affect full  Female chaperone present for pelvic and breast  portions of the physical exam  Assessment: 24 y.o. G1P1001 routine annual exam  Plan: Problem List Items Addressed This Visit    None    Visit Diagnoses    Screening examination for STD (sexually transmitted disease)    -  Primary   Relevant Orders   Cervicovaginal ancillary only   Migraine aura, persistent, intractable       Relevant Orders   Ambulatory referral to Neurology   Weight  loss advised       Relevant Orders   Ambulatory referral to Family Practice      1) 4) Gardasil Series discussed and if applicable offered to patient - Patient is unsure if she has previously completed 3 shot series - Given a brochure about Gardasil.  2) STI screening  wasoffered and accepted  3)  ASCCP guidelines and rational discussed.  Patient opts for every 3 years screening interval  4) Contraception - the patient is currently using  IUD.  She is happy with her current form of contraception and plans to continue We discussed safe sex practices to reduce her furture risk of STI's.  IUD will be okay for 6 years, (until 2023)  5) Daily migraines- with refer to neurology for further management and care  6) Obesity desires weight loss. Discussed exercise and healthy diet. Provided patient with information. Referral to Dr. Dalbert Garnet and weight loss clinic.   Return in  about 1 year (around 07/03/2020) for annual.  Adelene Idler MD [facility], [facility] 07/04/2019 1:36 PM

## 2019-07-04 NOTE — Patient Instructions (Signed)
Migraine Headache A migraine headache is an intense, throbbing pain on one side or both sides of the head. Migraine headaches may also cause other symptoms, such as nausea, vomiting, and sensitivity to light and noise. A migraine headache can last from 4 hours to 3 days. Talk with your doctor about what things may bring on (trigger) your migraine headaches. What are the causes? The exact cause of this condition is not known. However, a migraine may be caused when nerves in the brain become irritated and release chemicals that cause inflammation of blood vessels. This inflammation causes pain. This condition may be triggered or caused by:  Drinking alcohol.  Smoking.  Taking medicines, such as: ? Medicine used to treat chest pain (nitroglycerin). ? Birth control pills. ? Estrogen. ? Certain blood pressure medicines.  Eating or drinking products that contain nitrates, glutamate, aspartame, or tyramine. Aged cheeses, chocolate, or caffeine may also be triggers.  Doing physical activity. Other things that may trigger a migraine headache include:  Menstruation.  Pregnancy.  Hunger.  Stress.  Lack of sleep or too much sleep.  Weather changes.  Fatigue. What increases the risk? The following factors may make you more likely to experience migraine headaches:  Being a certain age. This condition is more common in people who are 25-55 years old.  Being female.  Having a family history of migraine headaches.  Being Caucasian.  Having a mental health condition, such as depression or anxiety.  Being obese. What are the signs or symptoms? The main symptom of this condition is pulsating or throbbing pain. This pain may:  Happen in any area of the head, such as on one side or both sides.  Interfere with daily activities.  Get worse with physical activity.  Get worse with exposure to bright lights or loud noises. Other symptoms may  include:  Nausea.  Vomiting.  Dizziness.  General sensitivity to bright lights, loud noises, or smells. Before you get a migraine headache, you may get warning signs (an aura). An aura may include:  Seeing flashing lights or having blind spots.  Seeing bright spots, halos, or zigzag lines.  Having tunnel vision or blurred vision.  Having numbness or a tingling feeling.  Having trouble talking.  Having muscle weakness. Some people have symptoms after a migraine headache (postdromal phase), such as:  Feeling tired.  Difficulty concentrating. How is this diagnosed? A migraine headache can be diagnosed based on:  Your symptoms.  A physical exam.  Tests, such as: ? CT scan or an MRI of the head. These imaging tests can help rule out other causes of headaches. ? Taking fluid from the spine (lumbar puncture) and analyzing it (cerebrospinal fluid analysis, or CSF analysis). How is this treated? This condition may be treated with medicines that:  Relieve pain.  Relieve nausea.  Prevent migraine headaches. Treatment for this condition may also include:  Acupuncture.  Lifestyle changes like avoiding foods that trigger migraine headaches.  Biofeedback.  Cognitive behavioral therapy. Follow these instructions at home: Medicines  Take over-the-counter and prescription medicines only as told by your health care provider.  Ask your health care provider if the medicine prescribed to you: ? Requires you to avoid driving or using heavy machinery. ? Can cause constipation. You may need to take these actions to prevent or treat constipation:  Drink enough fluid to keep your urine pale yellow.  Take over-the-counter or prescription medicines.  Eat foods that are high in fiber, such as beans, whole grains, and   fresh fruits and vegetables.  Limit foods that are high in fat and processed sugars, such as fried or sweet foods. Lifestyle  Do not drink alcohol.  Do not  use any products that contain nicotine or tobacco, such as cigarettes, e-cigarettes, and chewing tobacco. If you need help quitting, ask your health care provider.  Get at least 8 hours of sleep every night.  Find ways to manage stress, such as meditation, deep breathing, or yoga. General instructions      Keep a journal to find out what may trigger your migraine headaches. For example, write down: ? What you eat and drink. ? How much sleep you get. ? Any change to your diet or medicines.  If you have a migraine headache: ? Avoid things that make your symptoms worse, such as bright lights. ? It may help to lie down in a dark, quiet room. ? Do not drive or use heavy machinery. ? Ask your health care provider what activities are safe for you while you are experiencing symptoms.  Keep all follow-up visits as told by your health care provider. This is important. Contact a health care provider if:  You develop symptoms that are different or more severe than your usual migraine headache symptoms.  You have more than 15 headache days in one month. Get help right away if:  Your migraine headache becomes severe.  Your migraine headache lasts longer than 72 hours.  You have a fever.  You have a stiff neck.  You have vision loss.  Your muscles feel weak or like you cannot control them.  You start to lose your balance often.  You have trouble walking.  You faint.  You have a seizure. Summary  A migraine headache is an intense, throbbing pain on one side or both sides of the head. Migraines may also cause other symptoms, such as nausea, vomiting, and sensitivity to light and noise.  This condition may be treated with medicines and lifestyle changes. You may also need to avoid certain things that trigger a migraine headache.  Keep a journal to find out what may trigger your migraine headaches.  Contact your health care provider if you have more than 15 headache days in a  month or you develop symptoms that are different or more severe than your usual migraine headache symptoms. This information is not intended to replace advice given to you by your health care provider. Make sure you discuss any questions you have with your health care provider. Document Revised: 07/14/2018 Document Reviewed: 05/04/2018 Elsevier Patient Education  2020 [employer]. Exercising to Lose Weight Exercise is structured, repetitive physical activity to improve fitness and health. Getting regular exercise is important for everyone. It is especially important if you are overweight. Being overweight increases your risk of heart disease, stroke, diabetes, high blood pressure, and several types of cancer. Reducing your calorie intake and exercising can help you lose weight. Exercise is usually categorized as moderate or vigorous intensity. To lose weight, most people need to do a certain amount of moderate-intensity or vigorous-intensity exercise each week. Moderate-intensity exercise  Moderate-intensity exercise is any activity that gets you moving enough to burn at least three times more energy (calories) than if you were sitting. Examples of moderate exercise include:  Walking a mile in 15 minutes.  Doing light yard work.  Biking at an easy pace. Most people should get at least 150 minutes (2 hours and 30 minutes) a week of moderate-intensity exercise to maintain their body  weight. Vigorous-intensity exercise Vigorous-intensity exercise is any activity that gets you moving enough to burn at least six times more calories than if you were sitting. When you exercise at this intensity, you should be working hard enough that you are not able to carry on a conversation. Examples of vigorous exercise include:  Running.  Playing a team sport, such as football, basketball, and soccer.  Jumping rope. Most people should get at least 75 minutes (1 hour and 15 minutes) a week of  vigorous-intensity exercise to maintain their body weight. How can exercise affect me? When you exercise enough to burn more calories than you eat, you lose weight. Exercise also reduces body fat and builds muscle. The more muscle you have, the more calories you burn. Exercise also:  Improves mood.  Reduces stress and tension.  Improves your overall fitness, flexibility, and endurance.  Increases bone strength. The amount of exercise you need to lose weight depends on:  Your age.  The type of exercise.  Any health conditions you have.  Your overall physical ability. Talk to your health care provider about how much exercise you need and what types of activities are safe for you. What actions can I take to lose weight? Nutrition   Make changes to your diet as told by your health care provider or diet and nutrition specialist (dietitian). This may include: ? Eating fewer calories. ? Eating more protein. ? Eating less unhealthy fats. ? Eating a diet that includes fresh fruits and vegetables, whole grains, low-fat dairy products, and lean protein. ? Avoiding foods with added fat, salt, and sugar.  Drink plenty of water while you exercise to prevent dehydration or heat stroke. Activity  Choose an activity that you enjoy and set realistic goals. Your health care provider can help you make an exercise plan that works for you.  Exercise at a moderate or vigorous intensity most days of the week. ? The intensity of exercise may vary from person to person. You can tell how intense a workout is for you by paying attention to your breathing and heartbeat. Most people will notice their breathing and heartbeat get faster with more intense exercise.  Do resistance training twice each week, such as: ? Push-ups. ? Sit-ups. ? Lifting weights. ? Using resistance bands.  Getting short amounts of exercise can be just as helpful as long structured periods of exercise. If you have trouble  finding time to exercise, try to include exercise in your daily routine. ? Get up, stretch, and walk around every 30 minutes throughout the day. ? Go for a walk during your lunch break. ? Park your car farther away from your destination. ? If you take public transportation, get off one stop early and walk the rest of the way. ? Make phone calls while standing up and walking around. ? Take the stairs instead of elevators or escalators.  Wear comfortable clothes and shoes with good support.  Do not exercise so much that you hurt yourself, feel dizzy, or get very short of breath. Where to find more information  [place] Department of Health and Human Services: ThisPath.fi  Centers for Disease Control and Prevention (CDC): FootballExhibition.com.br Contact a health care provider:  Before starting a new exercise program.  If you have questions or concerns about your weight.  If you have a medical problem that keeps you from exercising. Get help right away if you have any of the following while exercising:  Injury.  Dizziness.  Difficulty breathing or shortness  of breath that does not go away when you stop exercising.  Chest pain.  Rapid heartbeat. Summary  Being overweight increases your risk of heart disease, stroke, diabetes, high blood pressure, and several types of cancer.  Losing weight happens when you burn more calories than you eat.  Reducing the amount of calories you eat in addition to getting regular moderate or vigorous exercise each week helps you lose weight. This information is not intended to replace advice given to you by your health care provider. Make sure you discuss any questions you have with your health care provider. Document Revised: 04/04/2017 Document Reviewed: 04/04/2017 Elsevier Patient Education  2020 [employer]. Budget-Friendly Healthy Eating There are many ways to save money at the grocery store and continue to eat healthy. You can be successful if  you:  Plan meals according to your budget.  Make a grocery list and only purchase food according to your grocery list.  Prepare food yourself. What are tips for following this plan?  Reading food labels  Compare food labels between brand name foods and the store brand. Often the nutritional value is the same, but the store brand is lower cost.  Look for products that do not have added sugar, fat, or salt (sodium). These often cost the same but are healthier for you. Products may be labeled as: ? Sugar-free. ? Nonfat. ? Low-fat. ? Sodium-free. ? Low-sodium.  Look for lean ground beef labeled as at least 92% lean and 8% fat. Shopping  Buy only the items on your grocery list and go only to the areas of the store that have the items on your list.  Use coupons only for foods and brands you normally buy. Avoid buying items you wouldn't normally buy simply because they are on sale.  Check online and in newspapers for weekly deals.  Buy healthy items from the bulk bins when available, such as herbs, spices, flour, pasta, nuts, and dried fruit.  Buy fruits and vegetables that are in season. Prices are usually lower on in-season produce.  Look at the unit price on the price tag. Use it to compare different brands and sizes to find out which item is the best deal.  Choose healthy items that are often low-cost, such as carrots, potatoes, apples, bananas, and oranges. Dried or canned beans are a low-cost protein source.  Buy in bulk and freeze extra food. Items you can buy in bulk include meats, fish, poultry, frozen fruits, and frozen vegetables.  Avoid buying "ready-to-eat" foods, such as pre-cut fruits and vegetables and pre-made salads.  If possible, shop around to discover where you can find the best prices. Consider other retailers such as dollar stores, larger [employer], local fruit and vegetable stands, and farmers markets.  Do not shop when you are hungry. If you shop  while hungry, it may be hard to stick to your list and budget.  Resist impulse buying. Use your grocery list as your official plan for the week.  Buy a variety of vegetables and fruits by purchasing fresh, frozen, and canned items.  Look at the top and bottom shelves for deals. Foods at eye level (eye level of an adult or child) are usually more expensive.  Be efficient with your time when shopping. The more time you spend at the store, the more money you are likely to spend.  To save money when choosing more expensive foods like meats and dairy: ? Choose cheaper cuts of meat, such as bone-in chicken thighs  and drumsticks instead of skinless and boneless chicken. When you are ready to prepare the chicken, you can remove the skin yourself to make it healthier. ? Choose lean meats like chicken or [place] instead of beef. ? Choose canned seafood, such as tuna, salmon, or sardines. ? Buy eggs as a low-cost source of protein. ? Buy dried beans and peas, such as lentils, split peas, or kidney beans instead of meats. Dried beans and peas are a good alternative source of protein. ? Buy the larger tubs of yogurt instead of individual-sized containers.  Choose water instead of sodas and other sweetened beverages.  Avoid buying chips, cookies, and other "junk food." These items are usually expensive and not healthy. Cooking  Make extra food and freeze the extras in meal-sized containers or in individual portions for fast meals and snacks.  Pre-cook on days when you have extra time to prepare meals in advance. You can keep these meals in the fridge or freezer and reheat for a quick meal.  When you come home from the grocery store, wash, peel, and cut fruits and vegetables so they are ready to use and eat. This will help reduce food waste. Meal planning  Do not eat out or get fast food. Prepare food at home.  Make a grocery list and make sure to bring it with you to the store. If you have a smart  phone, you could use your phone to create your shopping list.  Plan meals and snacks according to a grocery list and budget you create.  Use leftovers in your meal plan for the week.  Look for recipes where you can cook once and make enough food for two meals.  Include budget-friendly meals like stews, casseroles, and stir-fry dishes.  Try some meatless meals or try "no cook" meals like salads.  Make sure that half your plate is filled with fruits or vegetables. Choose from fresh, frozen, or canned fruits and vegetables. If eating canned, remember to rinse them before eating. This will remove any excess salt added for packaging. Summary  Eating healthy on a budget is possible if you plan your meals according to your budget, purchase according to your budget and grocery list, and prepare food yourself.  Tips for buying more food on a limited budget include buying generic brands, using coupons only for foods you normally buy, and buying healthy items from the bulk bins when available.  Tips for buying cheaper food to replace expensive food include choosing cheaper, lean cuts of meat, and buying dried beans and peas. This information is not intended to replace advice given to you by your health care provider. Make sure you discuss any questions you have with your health care provider. Document Revised: 03/23/2017 Document Reviewed: 03/23/2017 Elsevier Patient Education  [place].

## 2019-07-05 LAB — CERVICOVAGINAL ANCILLARY ONLY
Chlamydia: NEGATIVE
Comment: NEGATIVE
Comment: NEGATIVE
Comment: NORMAL
Neisseria Gonorrhea: NEGATIVE
Trichomonas: NEGATIVE

## 2019-07-05 NOTE — Progress Notes (Signed)
Please call patient with normal result.

## 2019-09-06 NOTE — Progress Notes (Signed)
NEUROLOGY CONSULTATION NOTE  Amanda Buck MRN: [id number] DOB: 05-Apr-1996  Referring provider: Adrian Prows, MD Primary care provider: No PCP  Reason for consult:  migraines  HISTORY OF PRESENT ILLNESS: Amanda Buck is a 24 year old right-handed Caucasian female who presents for migraines.  History supplemented by referring provider's note.  Migraines started in highschool.  Migraines are severe pounding right temporal with nausea, photophobia, phonophobia and sometimes vomits.  No visual disturbance, numbness or weakness.  They may wake her up at night.  May last up to 2 days.  They typically occur once a week.  Unknown triggers.  Sometimes laying down may help.  She notes that her eyesight isn't as good as it used to be.  No recent eye exams.  No visual obscurations or pulsatile tinnitus.    She also has near-daily dull diffuse headache as well.  Takes a pain reliever about 4 days out of the week. Current NSAIDS:  ibuprofen Current analgesics:  Acetaminophen, Extra-strength Excedrin Current triptans:  none Current ergotamine:  none Current anti-emetic:  none Current muscle relaxants:  none Current anti-anxiolytic:  none Current sleep aide:  none Current Antihypertensive medications:  none Current Antidepressant medications:  none Current Anticonvulsant medications:  none Current anti-CGRP:  none Current Vitamins/Herbal/Supplements:  none Current Antihistamines/Decongestants:  none Other therapy:  none Hormone/birth control:  Mirena  Past NSAIDS:  none Past analgesics:  none Past abortive triptans:  sumatriptan Past abortive ergotamine:  none Past muscle relaxants:  none Past anti-emetic:  none Past antihypertensive medications:  none Past antidepressant medications:  none Past anticonvulsant medications:  none Past anti-CGRP:  none Past vitamins/Herbal/Supplements:  none Other past therapies:  none  Caffeine:  No coffee.  2 12-16 oz Mt Dews daily,  sometimes Dr. Malachi Bonds or sweet tea. Diet:  Mt Dew, Dr. Malachi Bonds, sweet tea, Jackie Plum Aid.  32 oz water daily.  Skips breakfast.   Exercise:  Not routine Depression:  no; Anxiety:  Yes (work, home) Other pain:  Some right hip pain Sleep:  Poor.  Tosses and turns.  Wakes up 2 to 3 times a night. Family history of headache:  Brothers and sisters  PAST MEDICAL HISTORY: Past Medical History:  Diagnosis Date  . Anemia affecting pregnancy   . Asthma    No current treatment needed  . Headache(784.0)   . Obesity (BMI 35.0-39.9 without comorbidity) 05/31/2018   BMI 38.28 kg/m2  . Obesity affecting pregnancy in third trimester, antepartum     PAST SURGICAL HISTORY: Past Surgical History:  Procedure Laterality Date  . LAPAROSCOPIC APPENDECTOMY N/A 12/28/2016   Procedure: APPENDECTOMY LAPAROSCOPIC;  Surgeon: Clovis Riley, MD;  Location: [facility];  Service: General;  Laterality: N/A;    MEDICATIONS: Current Outpatient Medications on File Prior to Visit  Medication Sig Dispense Refill  . levonorgestrel (MIRENA) 20 MCG/24HR IUD 1 each by Intrauterine route once.      No current facility-administered medications on file prior to visit.    ALLERGIES: No Known Allergies  FAMILY HISTORY: Family History  Problem Relation Age of Onset  . ALS Paternal Grandmother        Died at 16  . Heart failure Paternal Grandfather        Died at 47    SOCIAL HISTORY: Social History   Socioeconomic History  . Marital status: Single    Spouse name: Not on file  . Number of children: 1  . Years of education: Not on file  . Highest education  level: Not on file  Occupational History  . Occupation: Nutritional therapist: [employer]  Tobacco Use  . Smoking status: Never Smoker  . Smokeless tobacco: Never Used  Substance and Sexual Activity  . Alcohol use: No  . Drug use: No  . Sexual activity: Yes    Partners: Male    Birth control/protection: I.U.D.    Comment: Mirena   Other Topics  Concern  . Not on file  Social History Narrative  . Not on file   Social Determinants of Health   Financial Resource Strain:   . Difficulty of Paying Living Expenses:   Food Insecurity:   . Worried About Programme researcher, broadcasting/film/video in the Last Year:   . Barista in the Last Year:   Transportation Needs:   . Freight forwarder (Medical):   Marland Kitchen Lack of Transportation (Non-Medical):   Physical Activity:   . Days of Exercise per Week:   . Minutes of Exercise per Session:   Stress:   . Feeling of Stress :   Social Connections:   . Frequency of Communication with Friends and Family:   . Frequency of Social Gatherings with Friends and Family:   . Attends Religious Services:   . Active Member of Clubs or Organizations:   . Attends Banker Meetings:   Marland Kitchen Marital Status:   Intimate Partner Violence:   . Fear of Current or Ex-Partner:   . Emotionally Abused:   Marland Kitchen Physically Abused:   . Sexually Abused:     PHYSICAL EXAM: Blood pressure 111/69, pulse 66, height 5\' 4"  (1.626 m), weight 242 lb 12.8 oz (110.1 kg), SpO2 100 %, unknown if currently breastfeeding. General: No acute distress.  Patient appears well-groomed.   Head:  Normocephalic/atraumatic Eyes:  fundi examined but not visualized Neck: supple, no paraspinal tenderness, full range of motion Back: No paraspinal tenderness Heart: regular rate and rhythm Lungs: Clear to auscultation bilaterally. Vascular: No carotid bruits. Neurological Exam: Mental status: alert and oriented to person, place, and time, recent and remote memory intact, fund of knowledge intact, attention and concentration intact, speech fluent and not dysarthric, language intact. Cranial nerves: CN I: not tested CN II: pupils equal, round and reactive to light, visual fields intact CN III, IV, VI:  full range of motion, no nystagmus, no ptosis CN V: facial sensation intact CN VII: upper and lower face symmetric CN VIII: hearing intact CN IX,  X: gag intact, uvula midline CN XI: sternocleidomastoid and trapezius muscles intact CN XII: tongue midline Bulk & Tone: normal, no fasciculations. Motor:  5/5 throughout  Sensation:  Pinprick and vibration sensation intact. Deep Tendon Reflexes:  2+ throughout, toes downgoing.  Finger to nose testing:  Without dysmetria.  Heel to shin:  Without dysmetria.  Gait:  Normal station and stride.  Able to turn and tandem walk. Romberg negative.  IMPRESSION: Chronic migraine without aura, without status migrainosus, not intractable Blurred vision  PLAN: 1. Refer to ophthalmology for formal eye exam regarding blurred vision and headaches.  2. For preventative management, start topiramate 25mg  at bedtime for 1 week, then increase to 50mg  at bedtime 3.  For abortive therapy, Maxalt MLT 10mg  with Zofran ODT 4mg  4.  Limit use of pain relievers to no more than 2 days out of week to prevent risk of rebound or medication-overuse headache. 5.  Keep headache diary 6.  Exercise, hydration, caffeine cessation, sleep hygiene, monitor for and avoid triggers 7.  Consider:  magnesium citrate 400mg  daily, riboflavin 400mg  daily, and coenzyme Q10 100mg  three times daily 8.  Follow up 4 months.   Thank you for allowing me to take part in the care of this patient.  , DO  CC: , MD

## 2019-09-07 ENCOUNTER — Encounter: Payer: Self-pay | Admitting: Neurology

## 2019-09-07 ENCOUNTER — Other Ambulatory Visit: Payer: Self-pay

## 2019-09-07 ENCOUNTER — Ambulatory Visit: Payer: 59 | Admitting: Neurology

## 2019-09-07 VITALS — BP 111/69 | HR 66 | Ht 64.0 in | Wt 242.8 lb

## 2019-09-07 DIAGNOSIS — G43709 Chronic migraine without aura, not intractable, without status migrainosus: Secondary | ICD-10-CM

## 2019-09-07 DIAGNOSIS — H538 Other visual disturbances: Secondary | ICD-10-CM

## 2019-09-07 MED ORDER — TOPIRAMATE 25 MG PO TABS: ORAL_TABLET | ORAL | 0 refills | Status: DC

## 2019-09-07 MED ORDER — RIZATRIPTAN BENZOATE 10 MG PO TBDP: ORAL_TABLET | ORAL | 5 refills | Status: DC

## 2019-09-07 MED ORDER — ONDANSETRON 4 MG PO TBDP: 4.0000 mg | ORAL_TABLET | Freq: Three times a day (TID) | ORAL | 5 refills | Status: DC | PRN

## 2019-09-07 NOTE — Patient Instructions (Signed)
1.  Start topiramate 25mg  at bedtime for one week, then 50mg  at bedtime.  Contact me for refill and whether we need to increase dose  2.  For rescue, take rizatriptan and ondansetron as directed.  Stop over the counter pain meds 3.  Limit use of pain relievers to no more than 2 days out of week to prevent risk of rebound or medication-overuse headache. 4.  Keep headache diary 5.  Exercise, hydration, caffeine cessation, sleep hygiene, monitor for and avoid triggers 6.  Consider:  magnesium citrate 400mg  daily, riboflavin 400mg  daily, and coenzyme Q10 100mg  three times daily 7. Will refer you to the eye doctor 8. Follow up 4 months   Migraine Headache A migraine headache is a very strong throbbing pain on one side or both sides of your head. This type of headache can also cause other symptoms. It can last from 4 hours to 3 days. Talk with your doctor about what things may bring on (trigger) this condition. What are the causes? The exact cause of this condition is not known. This condition may be triggered or caused by:  Drinking alcohol.  Smoking.  Taking medicines, such as: ? Medicine used to treat chest pain (nitroglycerin). ? Birth control pills. ? Estrogen. ? Some blood pressure medicines.  Eating or drinking certain products.  Doing physical activity. Other things that may trigger a migraine headache include:  Having a menstrual period.  Pregnancy.  Hunger.  Stress.  Not getting enough sleep or getting too much sleep.  Weather changes.  Tiredness (fatigue). What increases the risk?  Being 58-69 years old.  Being female.  Having a family history of migraine headaches.  Being Caucasian.  Having depression or anxiety.  Being very overweight. What are the signs or symptoms?  A throbbing pain. This pain may: ? Happen in any area of the head, such as on one side or both sides. ? Make it hard to do daily activities. ? Get worse with physical activity. ? Get  worse around bright lights or loud noises.  Other symptoms may include: ? Feeling sick to your stomach (nauseous). ? Vomiting. ? Dizziness. ? Being sensitive to bright lights, loud noises, or smells.  Before you get a migraine headache, you may get warning signs (an aura). An aura may include: ? Seeing flashing lights or having blind spots. ? Seeing bright spots, halos, or zigzag lines. ? Having tunnel vision or blurred vision. ? Having numbness or a tingling feeling. ? Having trouble talking. ? Having weak muscles.  Some people have symptoms after a migraine headache (postdromal phase), such as: ? Tiredness. ? Trouble thinking (concentrating). How is this treated?  Taking medicines that: ? Relieve pain. ? Relieve the feeling of being sick to your stomach. ? Prevent migraine headaches.  Treatment may also include: ? Having acupuncture. ? Avoiding foods that bring on migraine headaches. ? Learning ways to control your body functions (biofeedback). ? Therapy to help you know and deal with negative thoughts (cognitive behavioral therapy). Follow these instructions at home: Medicines  Take over-the-counter and prescription medicines only as told by your doctor.  Ask your doctor if the medicine prescribed to you: ? Requires you to avoid driving or using heavy machinery. ? Can cause trouble pooping (constipation). You may need to take these steps to prevent or treat trouble pooping:  Drink enough fluid to keep your pee (urine) pale yellow.  Take over-the-counter or prescription medicines.  Eat foods that are high in fiber. These include  beans, whole grains, and fresh fruits and vegetables.  Limit foods that are high in fat and sugar. These include fried or sweet foods. Lifestyle  Do not drink alcohol.  Do not use any products that contain nicotine or tobacco, such as cigarettes, e-cigarettes, and chewing tobacco. If you need help quitting, ask your doctor.  Get at least  8 hours of sleep every night.  Limit and deal with stress. General instructions      Keep a journal to find out what may bring on your migraine headaches. For example, write down: ? What you eat and drink. ? How much sleep you get. ? Any change in what you eat or drink. ? Any change in your medicines.  If you have a migraine headache: ? Avoid things that make your symptoms worse, such as bright lights. ? It may help to lie down in a dark, quiet room. ? Do not drive or use heavy machinery. ? Ask your doctor what activities are safe for you.  Keep all follow-up visits as told by your doctor. This is important. Contact a doctor if:  You get a migraine headache that is different or worse than others you have had.  You have more than 15 headache days in one month. Get help right away if:  Your migraine headache gets very bad.  Your migraine headache lasts longer than 72 hours.  You have a fever.  You have a stiff neck.  You have trouble seeing.  Your muscles feel weak or like you cannot control them.  You start to lose your balance a lot.  You start to have trouble walking.  You pass out (faint).  You have a seizure. Summary  A migraine headache is a very strong throbbing pain on one side or both sides of your head. These headaches can also cause other symptoms.  This condition may be treated with medicines and changes to your lifestyle.  Keep a journal to find out what may bring on your migraine headaches.  Contact a doctor if you get a migraine headache that is different or worse than others you have had.  Contact your doctor if you have more than 15 headache days in a month. This information is not intended to replace advice given to you by your health care provider. Make sure you discuss any questions you have with your health care provider. Document Revised: 07/14/2018 Document Reviewed: 05/04/2018 Elsevier Patient Education  [place].

## 2019-09-12 ENCOUNTER — Other Ambulatory Visit: Payer: Self-pay

## 2019-09-12 ENCOUNTER — Telehealth: Payer: Self-pay | Admitting: Neurology

## 2019-09-12 DIAGNOSIS — H538 Other visual disturbances: Secondary | ICD-10-CM

## 2019-09-12 NOTE — Telephone Encounter (Signed)
LMOVM New order for [facility] Per Pt

## 2019-09-12 NOTE — Telephone Encounter (Signed)
Patient would like a referral to the [facility] in [place]

## 2019-09-12 NOTE — Progress Notes (Signed)
Pt called to have her Opthalmolgy referral changed to [facility] in [place]

## 2019-09-17 DIAGNOSIS — R519 Headache, unspecified: Secondary | ICD-10-CM | POA: Diagnosis not present

## 2019-10-04 ENCOUNTER — Other Ambulatory Visit: Payer: Self-pay | Admitting: Neurology

## 2019-11-05 ENCOUNTER — Telehealth: Payer: Self-pay | Admitting: Neurology

## 2019-11-05 NOTE — Telephone Encounter (Signed)
[facility] 11/05/19 @ 1:25PM:  "Caller states she needs a refill on her migraine prescriptions. Caller states she still had to Excedrin with her migraine medication since the headache has gotten worse. Declined to speak with a nurse at this time."

## 2019-11-05 NOTE — Telephone Encounter (Signed)
Telephone call to pt, Pt has refills for her medication. Refills sent on 09/2019 and 10/2019.   As for the Zofran, Pt will call back with the alternate medication that will be covered by her insurance.

## 2020-01-21 NOTE — Progress Notes (Signed)
NEUROLOGY FOLLOW UP OFFICE NOTE  Amanda Buck [id number]  HISTORY OF PRESENT ILLNESS: Amanda Buck is a 24 year old right-handed Caucasian female who follows up for migraines.  UPDATE:  Started topiramate in June.  Maxalt ineffective so taking Excedrin.  Her insurance wouldn't cover the Zofran.  She reports reduced headache frequency.  Vision improved. Intensity:  severe Duration:  30 to 40 minutes Frequency:  Typically 3 in 30 day period.  However, they have increased a little over the past month because she missed some doses of topiramate. Frequency of abortive medication: no more than 3 days out of month.   Current NSAIDS:  ibuprofen Current analgesics:  Excedrin Extra-strength Current triptans:  none Current ergotamine:  none Current anti-emetic:  Zofran 4mg  Current muscle relaxants:  none Current anti-anxiolytic:  none Current sleep aide:  none Current Antihypertensive medications:  none Current Antidepressant medications:  none Current Anticonvulsant medications:  topiramate 50mg  at bedtime Current anti-CGRP:  none Current Vitamins/Herbal/Supplements:  none Current Antihistamines/Decongestants:  none Other therapy:  none Hormone/birth control:  Mirena  Caffeine:  No coffee.  Cut down on Dew (now 1or 2 12-16 oz.  Rarely Dr. . Diet:  Trying to increase water intake (64 oz daily).  Trying to eat a muffin for breakfast.  Exercise:  Not routine Depression:  no; Anxiety:  Yes (work, home) Other pain:  Some right hip pain Sleep:  Poor.  Tosses and turns.  Her daughter may keep her up.    HISTORY: Migraines started in highschool.  Migraines are severe pounding right temporal with nausea, photophobia, phonophobia and sometimes vomits.  No visual disturbance, numbness or weakness.  They may wake her up at night.  May last up to 2 days.  They typically occur once a week.  Unknown triggers.  Sometimes laying down may help.  She notes that her eyesight isn't as good  as it used to be.  No recent eye exams.  No visual obscurations or pulsatile tinnitus.    She also has near-daily dull diffuse headache as well.   Past NSAIDS:  none Past analgesics:  acetaminophen Past abortive triptans:  sumatriptan, rizatriptan Past abortive ergotamine:  none Past muscle relaxants:  none Past anti-emetic:  none Past antihypertensive medications:  none Past antidepressant medications:  none Past anticonvulsant medications:  none Past anti-CGRP:  none Past vitamins/Herbal/Supplements:  none Other past therapies:  none   Family history of headache:  Brothers and sisters  PAST MEDICAL HISTORY: Past Medical History:  Diagnosis Date  . Anemia affecting pregnancy   . Asthma    No current treatment needed  . Headache(784.0)   . Obesity (BMI 35.0-39.9 without comorbidity) 05/31/2018   BMI 38.28 kg/m2  . Obesity affecting pregnancy in third trimester, antepartum     MEDICATIONS: Current Outpatient Medications on File Prior to Visit  Medication Sig Dispense Refill  . levonorgestrel (MIRENA) 20 MCG/24HR IUD 1 each by Intrauterine route once.     . ondansetron (ZOFRAN ODT) 4 MG disintegrating tablet Take 1 tablet (4 mg total) by mouth every 8 (eight) hours as needed for nausea or vomiting. 20 tablet 5  . rizatriptan (MAXALT-MLT) 10 MG disintegrating tablet Take 1 tablet earliest onset of migraine.  May repeat in 2 hours if needed.  Maximum 2 tablets in 24 hours 9 tablet 5  . topiramate (TOPAMAX) 25 MG tablet TAKE 1 TABLET AT BEDTIME FOR 7 DAYS, THEN INCREASE TO 2 TABLETS AT BEDTIME 60 tablet 3   No  current facility-administered medications on file prior to visit.    ALLERGIES: Allergies  Allergen Reactions  . Vicodin [Hydrocodone-Acetaminophen] Rash    FAMILY HISTORY: Family History  Problem Relation Age of Onset  . ALS Paternal Grandmother        Died at 26  . Heart failure Paternal Grandfather        Died at 27   SOCIAL HISTORY: Social History    Socioeconomic History  . Marital status: Single    Spouse name: Not on file  . Number of children: 1  . Years of education: Not on file  . Highest education level: Not on file  Occupational History  . Occupation: Nutritional therapist: [employer]  Tobacco Use  . Smoking status: Never Smoker  . Smokeless tobacco: Never Used  Vaping Use  . Vaping Use: Never used  Substance and Sexual Activity  . Alcohol use: No  . Drug use: No  . Sexual activity: Yes    Partners: Male    Birth control/protection: I.U.D.    Comment: Mirena   Other Topics Concern  . Not on file  Social History Narrative   Right handed   Lives with family one story home   Social Determinants of Health   Financial Resource Strain:   . Difficulty of Paying Living Expenses: Not on file  Food Insecurity:   . Worried About Programme researcher, broadcasting/film/video in the Last Year: Not on file  . Ran Out of Food in the Last Year: Not on file  Transportation Needs:   . Lack of Transportation (Medical): Not on file  . Lack of Transportation (Non-Medical): Not on file  Physical Activity:   . Days of Exercise per Week: Not on file  . Minutes of Exercise per Session: Not on file  Stress:   . Feeling of Stress : Not on file  Social Connections:   . Frequency of Communication with Friends and Family: Not on file  . Frequency of Social Gatherings with Friends and Family: Not on file  . Attends Religious Services: Not on file  . Active Member of Clubs or Organizations: Not on file  . Attends Banker Meetings: Not on file  . Marital Status: Not on file  Intimate Partner Violence:   . Fear of Current or Ex-Partner: Not on file  . Emotionally Abused: Not on file  . Physically Abused: Not on file  . Sexually Abused: Not on file    PHYSICAL EXAM: Blood pressure 130/72, pulse 78, resp. rate 20, height 5\' 4"  (1.626 m), weight 250 lb (113.4 kg), SpO2 98 %, unknown if currently breastfeeding. General: No acute  distress.  Patient appears well-groomed.    IMPRESSION: Migraine without aura  PLAN: 1.  Topiramate 50mg  at bedtime 2.  Extra-strength Excedrin as needed.  Limit use of pain relievers to no more than 2 days out of week to prevent risk of rebound or medication-overuse headache. 3.  Keep headache diary 4.  Exercise, hydration, caffeine cessation, optimize sleep hygiene 5.  Follow up in 6 months.  , DO

## 2020-01-23 ENCOUNTER — Encounter: Payer: Self-pay | Admitting: Neurology

## 2020-01-23 ENCOUNTER — Other Ambulatory Visit: Payer: Self-pay

## 2020-01-23 ENCOUNTER — Ambulatory Visit (INDEPENDENT_AMBULATORY_CARE_PROVIDER_SITE_OTHER): Payer: 59 | Admitting: Neurology

## 2020-01-23 VITALS — BP 130/72 | HR 78 | Resp 20 | Ht 64.0 in | Wt 250.0 lb

## 2020-01-23 DIAGNOSIS — G43009 Migraine without aura, not intractable, without status migrainosus: Secondary | ICD-10-CM

## 2020-01-23 MED ORDER — TOPIRAMATE 50 MG PO TABS: 50.0000 mg | ORAL_TABLET | Freq: Every day | ORAL | 5 refills | Status: DC

## 2020-01-23 NOTE — Patient Instructions (Signed)
  1. Continue topiramate 50mg  at bedtime 2. Take Excedrin Extra-strength for migraine attacks 3. Limit use of pain relievers to no more than 2 days out of the week.  These medications include acetaminophen, NSAIDs (ibuprofen/Advil/Motrin, naproxen/Aleve, triptans (Imitrex/sumatriptan), Excedrin, and narcotics.  This will help reduce risk of rebound headaches. 4. Be aware of common food triggers:  - Caffeine:  coffee, black tea, cola, Mt. Dew  - Chocolate  - Dairy:  aged cheeses (brie, blue, cheddar, gouda, [place], provolone, [place], Swiss, etc), chocolate milk, buttermilk, sour cream, limit eggs and yogurt  - Nuts, peanut butter  - Alcohol  - Cereals/grains:  FRESH breads (fresh bagels, sourdough, doughnuts), yeast productions  - Processed/canned/aged/cured meats (pre-packaged deli meats, hotdogs)  - MSG/glutamate:  soy sauce, flavor enhancer, pickled/preserved/marinated foods  - Sweeteners:  aspartame (Equal, Nutrasweet).  Sugar and Splenda are okay  - Vegetables:  legumes (lima beans, lentils, snow peas, fava beans, pinto peans, peas, garbanzo beans), sauerkraut, onions, olives, pickles  - Fruit:  avocados, bananas, citrus fruit (orange, lemon, grapefruit), mango  - Other:  Frozen meals, macaroni and cheese 5. Routine exercise 6. Stay adequately hydrated (aim for 64 oz water daily) 7. Keep headache diary 8. Maintain proper stress management 9. Maintain proper sleep hygiene 10. Do not skip meals 11. Consider supplements:  magnesium citrate 400mg  daily, riboflavin 400mg  daily, coenzyme Q10 100mg  three times daily.

## 2020-07-14 ENCOUNTER — Other Ambulatory Visit: Payer: Self-pay

## 2020-07-14 ENCOUNTER — Encounter: Payer: Self-pay | Admitting: Obstetrics and Gynecology

## 2020-07-14 ENCOUNTER — Ambulatory Visit (INDEPENDENT_AMBULATORY_CARE_PROVIDER_SITE_OTHER): Payer: 59 | Admitting: Obstetrics and Gynecology

## 2020-07-14 VITALS — BP 130/70 | Ht 64.0 in | Wt 268.2 lb

## 2020-07-14 DIAGNOSIS — Z131 Encounter for screening for diabetes mellitus: Secondary | ICD-10-CM

## 2020-07-14 DIAGNOSIS — Z13220 Encounter for screening for lipoid disorders: Secondary | ICD-10-CM

## 2020-07-14 DIAGNOSIS — Z01419 Encounter for gynecological examination (general) (routine) without abnormal findings: Secondary | ICD-10-CM | POA: Diagnosis not present

## 2020-07-14 DIAGNOSIS — Z9189 Other specified personal risk factors, not elsewhere classified: Secondary | ICD-10-CM

## 2020-07-14 DIAGNOSIS — Z113 Encounter for screening for infections with a predominantly sexual mode of transmission: Secondary | ICD-10-CM

## 2020-07-14 DIAGNOSIS — Z1329 Encounter for screening for other suspected endocrine disorder: Secondary | ICD-10-CM

## 2020-07-14 DIAGNOSIS — E66813 Obesity, class 3: Secondary | ICD-10-CM

## 2020-07-14 DIAGNOSIS — R875 Abnormal microbiological findings in specimens from female genital organs: Secondary | ICD-10-CM

## 2020-07-14 DIAGNOSIS — Z0000 Encounter for general adult medical examination without abnormal findings: Secondary | ICD-10-CM | POA: Diagnosis not present

## 2020-07-14 DIAGNOSIS — R5383 Other fatigue: Secondary | ICD-10-CM

## 2020-07-14 DIAGNOSIS — Z30431 Encounter for routine checking of intrauterine contraceptive device: Secondary | ICD-10-CM

## 2020-07-14 DIAGNOSIS — Z6841 Body Mass Index (BMI) 40.0 and over, adult: Secondary | ICD-10-CM

## 2020-07-14 DIAGNOSIS — Z1239 Encounter for other screening for malignant neoplasm of breast: Secondary | ICD-10-CM

## 2020-07-14 NOTE — Progress Notes (Signed)
Gynecology Annual Exam  PCP: Patient, No Pcp Per (Inactive)  Chief Complaint:  Chief Complaint  Patient presents with  . Gynecologic Exam    History of Present Illness: Patient is a 25 y.o. G1P1001 presents for annual exam. The patient has no complaints today.   LMP: No LMP recorded. (Menstrual status: IUD). Average Interval: monthly Duration of flow: a few days Heavy Menses: no Intermenstrual Bleeding: no Dysmenorrhea: no  The patient does perform self breast exams.  There is no notable family history of breast or ovarian cancer in her family.  The patient reports her exercise generally consists of going to the gym a few times a week.  The patient denies current symptoms of depression.    Do you snore loudly? ( louder than talking or loud enough to be heard through closed doors?) uncertain Do you often feel tired, fatigued, or sleepy during daytime? yes Has anyone observed you stop breathing during sleep? no Do you have or are you being treated for high blood pressure? no BMI> 35 yes Age> 50 no Neck circumference > 40 cm 41 cm Female gender? No   Review of Systems: Review of Systems  Constitutional: Negative for chills, fever, malaise/fatigue and weight loss.  HENT: Negative for congestion, hearing loss and sinus pain.   Eyes: Negative for blurred vision and double vision.  Respiratory: Negative for cough, sputum production, shortness of breath and wheezing.   Cardiovascular: Negative for chest pain, palpitations, orthopnea and leg swelling.  Gastrointestinal: Negative for abdominal pain, constipation, diarrhea, nausea and vomiting.  Genitourinary: Positive for frequency. Negative for dysuria, flank pain, hematuria and urgency.  Musculoskeletal: Negative for back pain, falls and joint pain.  Skin: Negative for itching and rash.  Neurological: Positive for headaches. Negative for dizziness.  Psychiatric/Behavioral: Negative for depression, substance abuse and  suicidal ideas. The patient is not nervous/anxious.     Past Medical History:  Past Medical History:  Diagnosis Date  . Anemia affecting pregnancy   . Asthma    No current treatment needed  . Headache(784.0)   . Obesity (BMI 35.0-39.9 without comorbidity) 05/31/2018   BMI 38.28 kg/m2  . Obesity affecting pregnancy in third trimester, antepartum     Past Surgical History:  Past Surgical History:  Procedure Laterality Date  . LAPAROSCOPIC APPENDECTOMY N/A 12/28/2016   Procedure: APPENDECTOMY LAPAROSCOPIC;  Surgeon: Berna Bue, MD;  Location: [facility];  Service: General;  Laterality: N/A;    Gynecologic History:  No LMP recorded. (Menstrual status: IUD). Menarche: 11  History of fibroids, polyps, or ovarian cysts? : no  History of PCOS? no Hstory of Endometriosis? no History of abnormal pap smears? no Have you had any sexually transmitted infections in the past? no  She reports HPV vaccination in the past.   Last Pap: Results were: 05/31/2018 NIL    She identifies as a female. She is sexually active with men.   She denies dyspareunia. She currently uses IUD for contraception.    Obstetric History: G1P1001  Family History:  Family History  Problem Relation Age of Onset  . ALS Paternal Grandmother        Died at 27  . Heart failure Paternal Grandfather        Died at 24    Social History:  Social History   Socioeconomic History  . Marital status: Single    Spouse name: Not on file  . Number of children: 1  . Years of education: Not on file  .  Highest education level: Not on file  Occupational History  . Occupation: Nutritional therapist: [employer]  Tobacco Use  . Smoking status: Never Smoker  . Smokeless tobacco: Never Used  Vaping Use  . Vaping Use: Never used  Substance and Sexual Activity  . Alcohol use: No  . Drug use: No  . Sexual activity: Yes    Partners: Male    Birth control/protection: I.U.D.    Comment: Mirena   Other  Topics Concern  . Not on file  Social History Narrative   Right handed   Lives with family one story home   Social Determinants of Health   Financial Resource Strain: Not on file  Food Insecurity: Not on file  Transportation Needs: Not on file  Physical Activity: Not on file  Stress: Not on file  Social Connections: Not on file  Intimate Partner Violence: Not on file    Allergies:  Allergies  Allergen Reactions  . Vicodin [Hydrocodone-Acetaminophen] Rash    Medications: Prior to Admission medications   Medication Sig Start Date End Date Taking? Authorizing Provider  aspirin-acetaminophen-caffeine (EXCEDRIN MIGRAINE) 502 [id number] 0099 MG tablet Take by mouth every 6 (six) hours as needed for headache.   Yes [provider]  levonorgestrel (MIRENA) 20 MCG/24HR IUD 1 each by Intrauterine route once.  01/12/16  Yes [provider]  topiramate (TOPAMAX) 50 MG tablet Take 1 tablet (50 mg total) by mouth at bedtime. 01/23/20  Yes Jaffe, Adam R, DO  ondansetron (ZOFRAN ODT) 4 MG disintegrating tablet Take 1 tablet (4 mg total) by mouth every 8 (eight) hours as needed for nausea or vomiting. Patient not taking: Reported on 01/23/2020 09/07/19   Drema Dallas, DO    Physical Exam Vitals: Blood pressure 130/70, height 5\' 4"  (1.626 m), weight 268 lb 3.2 oz (121.7 kg), unknown if currently breastfeeding.  Physical Exam Constitutional:      Appearance: She is well-developed.  Genitourinary:     Genitourinary Comments: External: Normal appearing vulva. No lesions noted.  Speculum examination: Normal appearing cervix. No blood in the vaginal vault. White discharge.   IUD strings seen Bimanual examination: Uterus midline, non-tender, normal in size, shape and contour.  No CMT. No adnexal masses. No adnexal tenderness. Pelvis not fixed.  Breast Exam: breast equal without skin changes, nipple discharge, breast lump or enlarged lymph nodes   HENT:     Head: Normocephalic and  atraumatic.  Neck:     Thyroid: No thyromegaly.  Cardiovascular:     Rate and Rhythm: Normal rate and regular rhythm.     Heart sounds: Normal heart sounds.  Pulmonary:     Effort: Pulmonary effort is normal.     Breath sounds: Normal breath sounds.  Abdominal:     General: Bowel sounds are normal. There is no distension.     Palpations: Abdomen is soft. There is no mass.  Musculoskeletal:     Cervical back: Neck supple.  Neurological:     Mental Status: She is alert and oriented to person, place, and time.  Skin:    General: Skin is warm and dry.  Psychiatric:        Behavior: Behavior normal.        Thought Content: Thought content normal.        Judgment: Judgment normal.  Vitals reviewed.      Female chaperone present for pelvic and breast  portions of the physical exam  Assessment: 25 y.o. G1P1001 routine annual exam  Plan: Problem List Items Addressed This Visit   None   Visit Diagnoses    Encounter for annual routine gynecological examination    -  Primary   Health maintenance examination       Relevant Orders   Lipid panel   CBC With Differential   Comprehensive metabolic panel   Hemoglobin A1c   Screening for diabetes mellitus       Relevant Orders   Hemoglobin A1c   Screen for STD (sexually transmitted disease)       Relevant Orders   HIV antibody (with reflex)   RPR   Hepatitis panel, acute   NuSwab Vaginitis Plus (VG+)   Abnormal microbiological finding in specimen from female genital organ       Relevant Orders   NuSwab Vaginitis Plus (VG+)   Encounter for gynecological examination without abnormal finding       IUD check up       Encounter for screening breast examination       Risk factors for obstructive sleep apnea       Relevant Orders   Ambulatory referral to Sleep Studies   Screening for thyroid disorder       Screening cholesterol level       Relevant Orders   Lipid panel   Fatigue, unspecified type       Relevant Orders   TSH    CBC With Differential   Class 3 severe obesity due to excess calories without serious comorbidity with body mass index (BMI) of 45.0 to 49.9 in adult [facility])       Relevant Orders   Lipid panel   TSH   Hemoglobin A1c      1) STI screening was offered and accepted.  2) ASCCP guidelines and rational discussed.  Patient opts for every 3 years screening interval. Due next year  3) Contraception - would like to continue IUD. Reports IUD placed in 2017.   4) Routine healthcare maintenance including cholesterol, diabetes screening discussed To return fasting at a later date  5) At risk for sleep apnea, referral for testing made.  Adelene Idler MD, [facility], [facility] 07/14/2020 10:52 AM

## 2020-07-14 NOTE — Patient Instructions (Signed)
Institute of Medicine Recommended Dietary Allowances for Calcium and Vitamin D  Age (yr) Calcium Recommended Dietary Allowance (mg/day) Vitamin D Recommended Dietary Allowance (international units/day)  9-18 1,300 600  19-50 1,000 600  51-70 1,200 600  71 and older 1,200 800  Data from Institute of Medicine. Dietary reference intakes: calcium, vitamin D. [place], [place]: National Academies Press; 2011.    Exercising to Stay Healthy To become healthy and stay healthy, it is recommended that you do moderate-intensity and vigorous-intensity exercise. You can tell that you are exercising at a moderate intensity if your heart starts beating faster and you start breathing faster but can still hold a conversation. You can tell that you are exercising at a vigorous intensity if you are breathing much harder and faster and cannot hold a conversation while exercising. Exercising regularly is important. It has many health benefits, such as:  Improving overall fitness, flexibility, and endurance.  Increasing bone density.  Helping with weight control.  Decreasing body fat.  Increasing muscle strength.  Reducing stress and tension.  Improving overall health. How often should I exercise? Choose an activity that you enjoy, and set realistic goals. Your health care provider can help you make an activity plan that works for you. Exercise regularly as told by your health care provider. This may include:  Doing strength training two times a week, such as: ? Lifting weights. ? Using resistance bands. ? Push-ups. ? Sit-ups. ? Yoga.  Doing a certain intensity of exercise for a given amount of time. Choose from these options: ? A total of 150 minutes of moderate-intensity exercise every week. ? A total of 75 minutes of vigorous-intensity exercise every week. ? A mix of moderate-intensity and vigorous-intensity exercise every week. Children, pregnant women, people who have not exercised  regularly, people who are overweight, and older adults may need to talk with a health care provider about what activities are safe to do. If you have a medical condition, be sure to talk with your health care provider before you start a new exercise program. What are some exercise ideas? Moderate-intensity exercise ideas include:  Walking 1 mile (1.6 km) in about 15 minutes.  Biking.  Hiking.  Golfing.  Dancing.  Water aerobics. Vigorous-intensity exercise ideas include:  Walking 4.5 miles (7.2 km) or more in about 1 hour.  Jogging or running 5 miles (8 km) in about 1 hour.  Biking 10 miles (16.1 km) or more in about 1 hour.  Lap swimming.  Roller-skating or in-line skating.  Cross-country skiing.  Vigorous competitive sports, such as football, basketball, and soccer.  Jumping rope.  Aerobic dancing.   What are some everyday activities that can help me to get exercise?  Yard work, such as: ? Pushing a lawn mower. ? Raking and bagging leaves.  Washing your car.  Pushing a stroller.  Shoveling snow.  Gardening.  Washing windows or floors. How can I be more active in my day-to-day activities?  Use stairs instead of an elevator.  Take a walk during your lunch break.  If you drive, park your car farther away from your work or school.  If you take public transportation, get off one stop early and walk the rest of the way.  Stand up or walk around during all of your indoor phone calls.  Get up, stretch, and walk around every 30 minutes throughout the day.  Enjoy exercise with a friend. Support to continue exercising will help you keep a regular routine of activity. What guidelines   can I follow while exercising?  Before you start a new exercise program, talk with your health care provider.  Do not exercise so much that you hurt yourself, feel dizzy, or get very short of breath.  Wear comfortable clothes and wear shoes with good support.  Drink plenty of  water while you exercise to prevent dehydration or heat stroke.  Work out until your breathing and your heartbeat get faster. Where to find more information  [place] Department of Health and Human Services: www.hhs.gov  Centers for Disease Control and Prevention (CDC): www.cdc.gov Summary  Exercising regularly is important. It will improve your overall fitness, flexibility, and endurance.  Regular exercise also will improve your overall health. It can help you control your weight, reduce stress, and improve your bone density.  Do not exercise so much that you hurt yourself, feel dizzy, or get very short of breath.  Before you start a new exercise program, talk with your health care provider. This information is not intended to replace advice given to you by your health care provider. Make sure you discuss any questions you have with your health care provider. Document Revised: 03/04/2017 Document Reviewed: 02/10/2017 Elsevier Patient Education  2021 Elsevier Inc.   Budget-Friendly Healthy Eating There are many ways to save money at the grocery store and continue to eat healthy. You can be successful if you:  Plan meals according to your budget.  Make a grocery list and only purchase food according to your grocery list.  Prepare food yourself at home. What are tips for following this plan? Reading food labels  Compare food labels between brand name foods and the store brand. Often the nutritional value is the same, but the store brand is lower cost.  Look for products that do not have added sugar, fat, or salt (sodium). These often cost the same but are healthier for you. Products may be labeled as: ? Sugar-free. ? Nonfat. ? Low-fat. ? Sodium-free. ? Low-sodium.  Look for lean ground beef labeled as at least 92% lean and 8% fat. Shopping  Buy only the items on your grocery list and go only to the areas of the store that have the items on your list.  Use coupons only for  foods and brands you normally buy. Avoid buying items you wouldn't normally buy simply because they are on sale.  Check online and in newspapers for weekly deals.  Buy healthy items from the bulk bins when available, such as herbs, spices, flour, pasta, nuts, and dried fruit.  Buy fruits and vegetables that are in season. Prices are usually lower on in-season produce.  Look at the unit price on the price tag. Use it to compare different brands and sizes to find out which item is the best deal.  Choose healthy items that are often low-cost, such as carrots, potatoes, apples, bananas, and oranges. Dried or canned beans are a low-cost protein source.  Buy in bulk and freeze extra food. Items you can buy in bulk include meats, fish, poultry, frozen fruits, and frozen vegetables.  Avoid buying "ready-to-eat" foods, such as pre-cut fruits and vegetables and pre-made salads.  If possible, shop around to discover where you can find the best prices. Consider other retailers such as dollar stores, larger wholesale stores, local fruit and vegetable stands, and farmers markets.  Do not shop when you are hungry. If you shop while hungry, it may be hard to stick to your list and budget.  Resist impulse buying. Use your grocery   list as your official plan for the week.  Buy a variety of vegetables and fruits by purchasing fresh, frozen, and canned items.  Look at the top and bottom shelves for deals. Foods at eye level (eye level of an adult or child) are usually more expensive.  Be efficient with your time when shopping. The more time you spend at the store, the more money you are likely to spend.  To save money when choosing more expensive foods like meats and dairy: ? Choose cheaper cuts of meat, such as bone-in chicken thighs and drumsticks instead of skinless and boneless chicken. When you are ready to prepare the chicken, you can remove the skin yourself to make it healthier. ? Choose lean meats  like chicken or turkey instead of beef. ? Choose canned seafood, such as tuna, salmon, or sardines. ? Buy eggs as a low-cost source of protein. ? Buy dried beans and peas, such as lentils, split peas, or kidney beans instead of meats. Dried beans and peas are a good alternative source of protein. ? Buy the larger tubs of yogurt instead of individual-sized containers.  Choose water instead of sodas and other sweetened beverages.  Avoid buying chips, cookies, and other "junk food." These items are usually expensive and not healthy.   Cooking  Make extra food and freeze the extras in meal-sized containers or in individual portions for fast meals and snacks.  Pre-cook on days when you have extra time to prepare meals in advance. You can keep these meals in the fridge or freezer and reheat for a quick meal.  When you come home from the grocery store, wash, peel, and cut fruits and vegetables so they are ready to use and eat. This will help reduce food waste. Meal planning  Do not eat out or get fast food. Prepare food at home.  Make a grocery list and make sure to bring it with you to the store. If you have a smart phone, you could use your phone to create your shopping list.  Plan meals and snacks according to a grocery list and budget you create.  Use leftovers in your meal plan for the week.  Look for recipes where you can cook once and make enough food for two meals.  Prepare budget-friendly types of meals like stews, casseroles, and stir-fry dishes.  Try some meatless meals or try "no cook" meals like salads.  Make sure that half your plate is filled with fruits or vegetables. Choose from fresh, frozen, or canned fruits and vegetables. If eating canned, remember to rinse them before eating. This will remove any excess salt added for packaging. Summary  Eating healthy on a budget is possible if you plan your meals according to your budget, purchase according to your budget and  grocery list, and prepare food yourself.  Tips for buying more food on a limited budget include buying generic brands, using coupons only for foods you normally buy, and buying healthy items from the bulk bins when available.  Tips for buying cheaper food to replace expensive food include choosing cheaper, lean cuts of meat, and buying dried beans and peas. This information is not intended to replace advice given to you by your health care provider. Make sure you discuss any questions you have with your health care provider. Document Revised: 01/03/2020 Document Reviewed: 01/03/2020 Elsevier Patient Education  2021 Elsevier Inc.   Bone Health Bones protect organs, store calcium, anchor muscles, and support the whole body. Keeping your bones   strong is important, especially as you get older. You can take actions to help keep your bones strong and healthy. Why is keeping my bones healthy important? Keeping your bones healthy is important because your body constantly replaces bone cells. Cells get old, and new cells take their place. As we age, we lose bone cells because the body may not be able to make enough new cells to replace the old cells. The amount of bone cells and bone tissue you have is referred to as bone mass. The higher your bone mass, the stronger your bones. The aging process leads to an overall loss of bone mass in the body, which can increase the likelihood of:  Joint pain and stiffness.  Broken bones.  A condition in which the bones become weak and brittle (osteoporosis). A large decline in bone mass occurs in older adults. In women, it occurs about the time of menopause.   What actions can I take to keep my bones healthy? Good health habits are important for maintaining healthy bones. This includes eating nutritious foods and exercising regularly. To have healthy bones, you need to get enough of the right minerals and vitamins. Most nutrition experts recommend getting these  nutrients from the foods that you eat. In some cases, taking supplements may also be recommended. Doing certain types of exercise is also important for bone health. What are the nutritional recommendations for healthy bones? Eating a well-balanced diet with plenty of calcium and vitamin D will help to protect your bones. Nutritional recommendations vary from person to person. Ask your health care provider what is healthy for you. Here are some general guidelines. Get enough calcium Calcium is the most important (essential) mineral for bone health. Most people can get enough calcium from their diet, but supplements may be recommended for people who are at risk for osteoporosis. Good sources of calcium include:  Dairy products, such as low-fat or nonfat milk, cheese, and yogurt.  Dark green leafy vegetables, such as bok choy and broccoli.  Calcium-fortified foods, such as orange juice, cereal, bread, soy beverages, and tofu products.  Nuts, such as almonds. Follow these recommended amounts for daily calcium intake:  Children, age 1-3: 700 mg.  Children, age 4-8: 1,000 mg.  Children, age 9-13: 1,300 mg.  Teens, age 14-18: 1,300 mg.  Adults, age 19-50: 1,000 mg.  Adults, age 51-70: ? Men: 1,000 mg. ? Women: 1,200 mg.  Adults, age 71 or older: 1,200 mg.  Pregnant and breastfeeding females: ? Teens: 1,300 mg. ? Adults: 1,000 mg. Get enough vitamin D Vitamin D is the most essential vitamin for bone health. It helps the body absorb calcium. Sunlight stimulates the skin to make vitamin D, so be sure to get enough sunlight. If you live in a cold climate or you do not get outside often, your health care provider may recommend that you take vitamin D supplements. Good sources of vitamin D in your diet include:  Egg yolks.  Saltwater fish.  Milk and cereal fortified with vitamin D. Follow these recommended amounts for daily vitamin D intake:  Children and teens, age 1-18: 600  international units.  Adults, age 50 or younger: 400-800 international units.  Adults, age 51 or older: 800-1,000 international units. Get other important nutrients Other nutrients that are important for bone health include:  Phosphorus. This mineral is found in meat, poultry, dairy foods, nuts, and legumes. The recommended daily intake for adult men and adult women is 700 mg.  Magnesium. This mineral   is found in seeds, nuts, dark green vegetables, and legumes. The recommended daily intake for adult men is 400-420 mg. For adult women, it is 310-320 mg.  Vitamin K. This vitamin is found in green leafy vegetables. The recommended daily intake is 120 mg for adult men and 90 mg for adult women.   What type of physical activity is best for building and maintaining healthy bones? Weight-bearing and strength-building activities are important for building and maintaining healthy bones. Weight-bearing activities cause muscles and bones to work against gravity. Strength-building activities increase the strength of the muscles that support bones. Weight-bearing and muscle-building activities include:  Walking and hiking.  Jogging and running.  Dancing.  Gym exercises.  Lifting weights.  Tennis and racquetball.  Climbing stairs.  Aerobics. Adults should get at least 30 minutes of moderate physical activity on most days. Children should get at least 60 minutes of moderate physical activity on most days. Ask your health care provider what type of exercise is best for you.   How can I find out if my bone mass is low? Bone mass can be measured with an X-ray test called a bone mineral density (BMD) test. This test is recommended for all women who are age 65 or older. It may also be recommended for:  Men who are age 70 or older.  People who are at risk for osteoporosis because of: ? Having bones that break easily. ? Having a long-term disease that weakens bones, such as kidney disease or  rheumatoid arthritis. ? Having menopause earlier than normal. ? Taking medicine that weakens bones, such as steroids, thyroid hormones, or hormone treatment for breast cancer or prostate cancer. ? Smoking. ? Drinking three or more alcoholic drinks a day. If you find that you have a low bone mass, you may be able to prevent osteoporosis or further bone loss by changing your diet and lifestyle. Where can I find more information? For more information, check out the following websites:  National Osteoporosis Foundation: www.nof.org/patients  National Institutes of Health: www.bones.nih.gov  International Osteoporosis Foundation: www.iofbonehealth.org Summary  The aging process leads to an overall loss of bone mass in the body, which can increase the likelihood of broken bones and osteoporosis.  Eating a well-balanced diet with plenty of calcium and vitamin D will help to protect your bones.  Weight-bearing and strength-building activities are also important for building and maintaining strong bones.  Bone mass can be measured with an X-ray test called a bone mineral density (BMD) test. This information is not intended to replace advice given to you by your health care provider. Make sure you discuss any questions you have with your health care provider. Document Revised: 04/18/2017 Document Reviewed: 04/18/2017 Elsevier Patient Education  2021 Elsevier Inc.   

## 2020-07-15 ENCOUNTER — Other Ambulatory Visit: Payer: 59

## 2020-07-15 DIAGNOSIS — Z13220 Encounter for screening for lipoid disorders: Secondary | ICD-10-CM | POA: Diagnosis not present

## 2020-07-15 DIAGNOSIS — R5383 Other fatigue: Secondary | ICD-10-CM | POA: Diagnosis not present

## 2020-07-15 DIAGNOSIS — Z0000 Encounter for general adult medical examination without abnormal findings: Secondary | ICD-10-CM

## 2020-07-15 DIAGNOSIS — R69 Illness, unspecified: Secondary | ICD-10-CM | POA: Diagnosis not present

## 2020-07-15 DIAGNOSIS — Z131 Encounter for screening for diabetes mellitus: Secondary | ICD-10-CM

## 2020-07-15 DIAGNOSIS — Z113 Encounter for screening for infections with a predominantly sexual mode of transmission: Secondary | ICD-10-CM | POA: Diagnosis not present

## 2020-07-15 DIAGNOSIS — Z6841 Body Mass Index (BMI) 40.0 and over, adult: Secondary | ICD-10-CM | POA: Diagnosis not present

## 2020-07-16 LAB — COMPREHENSIVE METABOLIC PANEL
ALT: 19 IU/L (ref 0–32)
AST: 22 IU/L (ref 0–40)
Albumin/Globulin Ratio: 1.9 (ref 1.2–2.2)
Albumin: 4.7 g/dL (ref 3.9–5.0)
Alkaline Phosphatase: 79 IU/L (ref 44–121)
BUN/Creatinine Ratio: 13 (ref 9–23)
BUN: 13 mg/dL (ref 6–20)
Bilirubin Total: 0.7 mg/dL (ref 0.0–1.2)
CO2: 21 mmol/L (ref 20–29)
Calcium: 9.3 mg/dL (ref 8.7–10.2)
Chloride: 102 mmol/L (ref 96–106)
Creatinine, Ser: 1.02 mg/dL — ABNORMAL HIGH (ref 0.57–1.00)
Globulin, Total: 2.5 g/dL (ref 1.5–4.5)
Glucose: 79 mg/dL (ref 65–99)
Potassium: 4.4 mmol/L (ref 3.5–5.2)
Sodium: 140 mmol/L (ref 134–144)
Total Protein: 7.2 g/dL (ref 6.0–8.5)
eGFR: 79 mL/min/{1.73_m2} (ref >59)

## 2020-07-16 LAB — CBC WITH DIFFERENTIAL
Basophils Absolute: 0 10*3/uL (ref 0.0–0.2)
Basos: 1 %
EOS (ABSOLUTE): 0.3 10*3/uL (ref 0.0–0.4)
Eos: 6 %
Hematocrit: 39.9 % (ref 34.0–46.6)
Hemoglobin: 13.3 g/dL (ref 11.1–15.9)
Immature Grans (Abs): 0 10*3/uL (ref 0.0–0.1)
Immature Granulocytes: 1 %
Lymphocytes Absolute: 1.5 10*3/uL (ref 0.7–3.1)
Lymphs: 27 %
MCH: 29.8 pg (ref 26.6–33.0)
MCHC: 33.3 g/dL (ref 31.5–35.7)
MCV: 89 fL (ref 79–97)
Monocytes Absolute: 0.4 10*3/uL (ref 0.1–0.9)
Monocytes: 7 %
Neutrophils Absolute: 3.4 10*3/uL (ref 1.4–7.0)
Neutrophils: 58 %
RBC: 4.47 x10E6/uL (ref 3.77–5.28)
RDW: 12 % (ref 11.7–15.4)
WBC: 5.7 10*3/uL (ref 3.4–10.8)

## 2020-07-16 LAB — HEMOGLOBIN A1C
Est. average glucose Bld gHb Est-mCnc: 108 mg/dL
Hgb A1c MFr Bld: 5.4 % (ref 4.8–5.6)

## 2020-07-16 LAB — HEPATITIS PANEL, ACUTE
Hep A IgM: NEGATIVE
Hep B C IgM: NEGATIVE
Hep C Virus Ab: <0.1 s/co ratio (ref 0.0–0.9)
Hepatitis B Surface Ag: NEGATIVE

## 2020-07-16 LAB — LIPID PANEL
Chol/HDL Ratio: 3.2 ratio (ref 0.0–4.4)
Cholesterol, Total: 181 mg/dL (ref 100–199)
HDL: 56 mg/dL (ref >39)
LDL Chol Calc (NIH): 110 mg/dL — ABNORMAL HIGH (ref 0–99)
Triglycerides: 83 mg/dL (ref 0–149)
VLDL Cholesterol Cal: 15 mg/dL (ref 5–40)

## 2020-07-16 LAB — HIV ANTIBODY (ROUTINE TESTING W REFLEX): HIV Screen 4th Generation wRfx: NONREACTIVE

## 2020-07-16 LAB — RPR: RPR Ser Ql: NONREACTIVE

## 2020-07-16 LAB — TSH: TSH: 3.61 u[IU]/mL (ref 0.450–4.500)

## 2020-07-17 LAB — NUSWAB VAGINITIS PLUS (VG+)
Candida albicans, NAA: NEGATIVE
Candida glabrata, NAA: NEGATIVE
Chlamydia trachomatis, NAA: NEGATIVE
Neisseria gonorrhoeae, NAA: NEGATIVE
Trich vag by NAA: NEGATIVE

## 2020-07-21 NOTE — Progress Notes (Signed)
NEUROLOGY FOLLOW UP OFFICE NOTE  Amanda Buck [id number]  Assessment/Plan:   Migraine without aura, without status migrainosus, not intractable  1.  Migraine prevention:  topiramate 50mg  at bedtime.  Consider setting a reminder on her phone 2.  Migraine rescue:  Excedrin Extra-strength 3.  Limit use of pain relievers to no more than 2 days out of week to prevent risk of rebound or medication-overuse headache. 4.  Keep headache diary 5.  Follow up 9 months  Subjective:  is a 47 year oldright-handed Caucasian female who follows up for migraines.  UPDATE: Sometimes forgets to take the topiramate even though it is sitting at her bathroom sink.   Intensity:  moderate Duration:  30 to 40 minutes Frequency:  3 to 4 days a month Current NSAIDS:ibuprofen Current analgesics:Excedrin Extra-strength Current triptans:none Current ergotamine:none Current anti-emetic:Zofran 4mg  Current muscle relaxants:none Current anti-anxiolytic:none Current sleep aide:none Current Antihypertensive medications:none Current Antidepressant medications:none Current Anticonvulsant medications:topiramate 50mg  at bedtime Current anti-CGRP:none Current Vitamins/Herbal/Supplements:none Current Antihistamines/Decongestants:none Other therapy:none Hormone/birth control:Mirena  Caffeine:No coffee. Cut down on 30 Dew Diet:Trying to increase water intake (64 oz daily). Trying to eat a muffin for breakfast.  1 gatorade a day Exercise:gym twice a week Depression:no; Anxiety:Yes (work, home) Other pain:Some right hip pain Sleep: Poor. Tosses and turns. Her daughter may keep her up.    HISTORY: Migraines started in highschool. Migraines are severe pounding right temporal with nausea, photophobia, phonophobia and sometimes vomits. No visual disturbance, numbness or weakness. They may wake her up at night. May last up to 2 days. They  typically occur once a week. Unknown triggers. Sometimes laying down may help. She notes that her eyesight isn't as good as it used to be. No recent eye exams. No visual obscurations or pulsatile tinnitus.   She also has near-daily dull diffuse headache as well.   Past NSAIDS:none Past analgesics:acetaminophen Past abortive triptans:sumatriptan, rizatriptan Past abortive ergotamine:none Past muscle relaxants:none Past anti-emetic:none Past antihypertensive medications:none Past antidepressant medications:none Past anticonvulsant medications:none Past anti-CGRP:none Past vitamins/Herbal/Supplements:none Other past therapies:none   Family history of headache:Brothers and sisters  PAST MEDICAL HISTORY: Past Medical History:  Diagnosis Date  . Anemia affecting pregnancy   . Asthma    No current treatment needed  . Headache(784.0)   . Obesity (BMI 35.0-39.9 without comorbidity) 05/31/2018   BMI 38.28 kg/m2  . Obesity affecting pregnancy in third trimester, antepartum     MEDICATIONS: Current Outpatient Medications on File Prior to Visit  Medication Sig Dispense Refill  . aspirin-acetaminophen-caffeine (EXCEDRIN MIGRAINE) 250-250-65 MG tablet Take by mouth every 6 (six) hours as needed for headache.    . levonorgestrel (MIRENA) 20 MCG/24HR IUD 1 each by Intrauterine route once.     . ondansetron (ZOFRAN ODT) 4 MG disintegrating tablet Take 1 tablet (4 mg total) by mouth every 8 (eight) hours as needed for nausea or vomiting. (Patient not taking: Reported on 01/23/2020) 20 tablet 5  . topiramate (TOPAMAX) 50 MG tablet Take 1 tablet (50 mg total) by mouth at bedtime. 30 tablet 5   No current facility-administered medications on file prior to visit.    ALLERGIES: Allergies  Allergen Reactions  . Vicodin [Hydrocodone-Acetaminophen] Rash    FAMILY HISTORY: Family History  Problem Relation Age of Onset  . ALS Paternal Grandmother         Died at 45  . Heart failure Paternal Grandfather        Died at 18      Objective:  Blood pressure 112/70, pulse 72, height 5\' 5"  (1.651 m), weight 270 lb (122.5 kg), SpO2 100 %, unknown if currently breastfeeding. General: No acute distress.  Patient appears well-groomed.   Head:  Normocephalic/atraumatic Eyes:  Fundi examined but not visualized Neck: supple, no paraspinal tenderness, full range of motion Heart:  Regular rate and rhythm Lungs:  Clear to auscultation bilaterally Back: No paraspinal tenderness Neurological Exam: alert and oriented to person, place, and time. Speech fluent and not dysarthric, language intact.  CN II-XII intact. Bulk and tone normal, muscle strength 5/5 throughout.  Sensation to light touch intact.  Deep tendon reflexes 2+ throughout.  Finger to nose and heel to shin testing intact.  Gait normal, Romberg negative.   , DO

## 2020-07-23 ENCOUNTER — Other Ambulatory Visit: Payer: Self-pay

## 2020-07-23 ENCOUNTER — Ambulatory Visit: Payer: 59 | Admitting: Neurology

## 2020-07-23 ENCOUNTER — Encounter: Payer: Self-pay | Admitting: Neurology

## 2020-07-23 VITALS — BP 112/70 | HR 72 | Ht 65.0 in | Wt 270.0 lb

## 2020-07-23 DIAGNOSIS — G43009 Migraine without aura, not intractable, without status migrainosus: Secondary | ICD-10-CM | POA: Diagnosis not present

## 2020-07-23 MED ORDER — TOPIRAMATE 50 MG PO TABS: 50.0000 mg | ORAL_TABLET | Freq: Every day | ORAL | 5 refills | Status: DC

## 2020-07-23 NOTE — Patient Instructions (Signed)
1.  Refilled topiramate 50mg  at bedtime 2.  Excedrin Extra-strength as needed.  Limit use of pain relievers to no more than 2 days out of week to prevent risk of rebound or medication-overuse headache. 3.  Keep headache diary

## 2020-08-04 ENCOUNTER — Ambulatory Visit: Payer: 59 | Admitting: Internal Medicine

## 2020-08-04 NOTE — Progress Notes (Signed)
Pt did not show for scheduled appointment. Office staff will reach out to reschedule.  

## 2020-09-15 ENCOUNTER — Ambulatory Visit: Payer: 59 | Admitting: Internal Medicine

## 2020-09-15 NOTE — Progress Notes (Signed)
Pt canceled her appointment. Office staff will reach out to reschedule.  

## 2020-10-27 ENCOUNTER — Encounter: Payer: Self-pay | Admitting: Obstetrics and Gynecology

## 2020-10-27 ENCOUNTER — Other Ambulatory Visit: Payer: Self-pay

## 2020-10-27 ENCOUNTER — Ambulatory Visit (INDEPENDENT_AMBULATORY_CARE_PROVIDER_SITE_OTHER): Payer: 59 | Admitting: Obstetrics and Gynecology

## 2020-10-27 VITALS — BP 124/70 | Ht 65.0 in | Wt 269.0 lb

## 2020-10-27 DIAGNOSIS — R102 Pelvic and perineal pain: Secondary | ICD-10-CM | POA: Diagnosis not present

## 2020-10-27 DIAGNOSIS — T8384XA Pain from genitourinary prosthetic devices, implants and grafts, initial encounter: Secondary | ICD-10-CM

## 2020-10-27 DIAGNOSIS — R300 Dysuria: Secondary | ICD-10-CM | POA: Diagnosis not present

## 2020-10-27 DIAGNOSIS — R875 Abnormal microbiological findings in specimens from female genital organs: Secondary | ICD-10-CM

## 2020-10-27 DIAGNOSIS — N730 Acute parametritis and pelvic cellulitis: Secondary | ICD-10-CM

## 2020-10-27 LAB — POCT URINALYSIS DIPSTICK
Bilirubin, UA: NEGATIVE
Blood, UA: NEGATIVE
Glucose, UA: NEGATIVE
Ketones, UA: NEGATIVE
Leukocytes, UA: NEGATIVE
Nitrite, UA: NEGATIVE
Protein, UA: NEGATIVE
Spec Grav, UA: 1.025 (ref 1.010–1.025)
Urobilinogen, UA: 0.2 E.U./dL
pH, UA: 5 (ref 5.0–8.0)

## 2020-10-27 MED ORDER — METRONIDAZOLE 500 MG PO TABS: 500.0000 mg | ORAL_TABLET | Freq: Two times a day (BID) | ORAL | 0 refills | Status: AC

## 2020-10-27 MED ORDER — CEFTRIAXONE SODIUM 500 MG IJ SOLR
500.0000 mg | Freq: Once | INTRAMUSCULAR | Status: AC
  Administered 2020-10-27: 500 mg via INTRAMUSCULAR

## 2020-10-27 MED ORDER — DOXYCYCLINE HYCLATE 100 MG PO CAPS: 100.0000 mg | ORAL_CAPSULE | Freq: Two times a day (BID) | ORAL | 0 refills | Status: AC

## 2020-10-27 NOTE — Progress Notes (Signed)
Patient ID: Amanda Buck, female   DOB: 09-11-1995, 25 y.o.   MRN: [id number]  Reason for Consult: Gynecologic Exam   Referred by No ref. provider found  Subjective:     HPI:  Amanda Buck is a 25 y.o. female.  She presents today for IUD concerns.  She reports that she has been having some pain and discomfort with intercourse.  She reports that her partner has felt her IUD strings and is being poked by them during intercourse.  She reports that she has some discomfort with urination.  She has general discomfort across her lower abdomen.  She is considering switching methods to a oral contraceptive pill but worries that she might not be able to take the pill every day reliably.  She does not want to obtain pregnancy.  Gynecological History  No LMP recorded. (Menstrual status: IUD). M Past Medical History:  Diagnosis Date   Anemia affecting pregnancy    Asthma    No current treatment needed   Headache(784.0)    Obesity (BMI 35.0-39.9 without comorbidity) 05/31/2018   BMI 38.28 kg/m2   Obesity affecting pregnancy in third trimester, antepartum    Family History  Problem Relation Age of Onset   ALS Paternal Grandmother        Died at 92   Heart failure Paternal Grandfather        Died at 67   Past Surgical History:  Procedure Laterality Date   LAPAROSCOPIC APPENDECTOMY N/A 12/28/2016   Procedure: APPENDECTOMY LAPAROSCOPIC;  Surgeon: Berna Bue, MD;  Location: [facility];  Service: General;  Laterality: N/A;    Short Social History:  Social History   Tobacco Use   Smoking status: Never   Smokeless tobacco: Never  Substance Use Topics   Alcohol use: No    Allergies  Allergen Reactions   Vicodin [Hydrocodone-Acetaminophen] Rash    Current Outpatient Medications  Medication Sig Dispense Refill   aspirin-acetaminophen-caffeine (EXCEDRIN MIGRAINE) 250-250-65 MG tablet Take by mouth every 6 (six) hours as needed for headache.     levonorgestrel (MIRENA) 20  MCG/24HR IUD 1 each by Intrauterine route once.      topiramate (TOPAMAX) 50 MG tablet Take 1 tablet (50 mg total) by mouth at bedtime. 30 tablet 5   ondansetron (ZOFRAN ODT) 4 MG disintegrating tablet Take 1 tablet (4 mg total) by mouth every 8 (eight) hours as needed for nausea or vomiting. (Patient not taking: No sig reported) 20 tablet 5   No current facility-administered medications for this visit.    Review of Systems  Constitutional: Negative for chills, fatigue, fever and unexpected weight change.  HENT: Negative for trouble swallowing.  Eyes: Negative for loss of vision.  Respiratory: Negative for cough, shortness of breath and wheezing.  Cardiovascular: Negative for chest pain, leg swelling, palpitations and syncope.  GI: Negative for abdominal pain, blood in stool, diarrhea, nausea and vomiting.  GU: Positive for dysuria. Negative for difficulty urinating, frequency and hematuria.  Musculoskeletal: Negative for back pain, leg pain and joint pain.  Skin: Negative for rash.  Neurological: Negative for dizziness, headaches, light-headedness, numbness and seizures.  Psychiatric: Negative for behavioral problem, confusion, depressed mood and sleep disturbance.       Objective:  Objective   Vitals:   10/27/20 0909  BP: 124/70  Weight: 269 lb (122 kg)  Height: 5\' 5"  (1.651 m)   Body mass index is 44.76 kg/m.  Physical Exam Vitals and nursing note reviewed. Exam conducted with a  chaperone present.  Constitutional:      Appearance: Normal appearance. She is well-developed.  HENT:     Head: Normocephalic and atraumatic.  Eyes:     Extraocular Movements: Extraocular movements intact.     Pupils: Pupils are equal, round, and reactive to light.  Cardiovascular:     Rate and Rhythm: Normal rate and regular rhythm.  Pulmonary:     Effort: Pulmonary effort is normal. No respiratory distress.     Breath sounds: Normal breath sounds.  Abdominal:     General: Abdomen is flat.      Palpations: Abdomen is soft.  Genitourinary:    Comments: External: Normal appearing vulva. No lesions noted.  Speculum examination: Normal appearing cervix. No blood in the vaginal vault. No discharge.   IUD strings seen.  IUD strings trimmed. Bimanual examination: Uterus midline, tender, normal in size, shape and contour.  No CMT. No adnexal masses. + bilateral adnexal tenderness. Pelvis not fixed.  Breast exam: exam not performed Musculoskeletal:        General: No signs of injury.  Skin:    General: Skin is warm and dry.  Neurological:     Mental Status: She is alert and oriented to person, place, and time.  Psychiatric:        Behavior: Behavior normal.        Thought Content: Thought content normal.        Judgment: Judgment normal.    Assessment/Plan:     25 year old with lower abdominal and pelvic pain with IUD in place. Will check new swab for infection source. Will treat prophylactically for PID.  Rocephin given today IM.  2. IUD strings trimmed.  3. Discussed how to switch methods and provide patient with a handout from [facility].  Patient does not want to initiate birth control pill today and is happy with IUD overall if it is possible to keep the IUD that is her preference.  4. Will order pelvic ultrasound and follow-up with the patient afterwards.  5. UA not suggestive of infection  More than 20 minutes were spent face to face with the patient in the room, reviewing the medical record, labs and images, and coordinating care for the patient. The plan of management was discussed in detail and counseling was provided.    Adelene Idler MD [facility], [facility] 10/27/2020 9:27 AM

## 2020-10-27 NOTE — Patient Instructions (Signed)
Pelvic Inflammatory Disease  Pelvic inflammatory disease (PID) is an infection in some or all of the female reproductive organs. PID can be in the womb (uterus), ovaries, fallopian tubes, or nearby tissues that are inside the lower belly area (pelvis). PID can lead to problems if it is not treated. What are the causes? Germs (bacteria) that are spread during sex. This is the most common cause. Germs in the vagina that are not spread during sex. Germs that travel up from the vagina or cervix to the reproductive organs after: The birth of a baby. A miscarriage. An abortion. Pelvic surgery. Insertion of an intrauterine device (IUD). A sexual assault. What increases the risk? Being younger than 25 years old. Having sex at a young age. Having a history of STI (sexually transmitted infection) or PID. Not using barrier birth control, such as condoms. Having a lot of sex partners. Having sex with someone who has symptoms of an STI. Using a douche. Having an IUD put in place. What are the signs or symptoms? Pain in the belly area. Fever. Chills. Discharge from the vagina that is not normal. Bleeding from the womb that is not normal. Pain soon after the end of a menstrual period. Pain when you pee (urinate). Pain with sex. Feeling sick to your stomach (nauseous) or throwing up (vomiting). How is this treated? Antibiotic medicines. In very bad cases, these may be given through an IV tube. Surgery. This is rare. Efforts to stop the spread of the infection. Sex partners may need to be treated. It may take weeks until you feel all better. Your doctor may test you for infection again after you finish treatment. You should also be checked for HIV (human immunodeficiency virus). Follow these instructions at home: Take over-the-counter and prescription medicines only as told by your doctor. If you were prescribed an antibiotic medicine, take it as told by your doctor. Do not stop taking it even  if you start to feel better. Do not have sex until treatment is done or as told by your doctor. Tell your sex partner if you have PID. Your partner may need to be treated. Keep all follow-up visits as told by your doctor. This is important. Contact a doctor if: You have more fluid or fluid that is not normal coming from your vagina. Your pain does not improve. You throw up. You have a fever. You cannot take your medicines. Your partner has an STI. You have pain when you pee. Get help right away if: You have more pain in the belly area. You have chills. You are not better in 72 hours with treatment. Summary Pelvic inflammatory disease (PID) is caused by an infection in some or all of the female reproductive organs. PID is a serious infection. This infection is most often treated with antibiotics. Do not have sex until treatment is done or as told by your doctor. This information is not intended to replace advice given to you by your health care provider. Make sure you discuss any questions you have with your healthcare provider. Document Revised: 12/08/2017 Document Reviewed: 12/14/2017 Elsevier Patient Education  2022 Elsevier Inc.  

## 2020-10-30 LAB — NUSWAB VAGINITIS PLUS (VG+)
Candida albicans, NAA: NEGATIVE
Candida glabrata, NAA: NEGATIVE
Chlamydia trachomatis, NAA: NEGATIVE
Neisseria gonorrhoeae, NAA: NEGATIVE
Trich vag by NAA: NEGATIVE

## 2020-11-11 ENCOUNTER — Other Ambulatory Visit: Payer: Self-pay

## 2020-11-11 ENCOUNTER — Ambulatory Visit
Admission: RE | Admit: 2020-11-11 | Discharge: 2020-11-11 | Disposition: A | Discharge status: Home / Self Care | Payer: 59 | Source: Ambulatory Visit | Attending: Obstetrics and Gynecology | Admitting: Obstetrics and Gynecology

## 2020-11-11 DIAGNOSIS — N730 Acute parametritis and pelvic cellulitis: Secondary | ICD-10-CM | POA: Insufficient documentation

## 2020-11-11 DIAGNOSIS — R102 Pelvic and perineal pain: Secondary | ICD-10-CM | POA: Diagnosis not present

## 2020-11-11 DIAGNOSIS — T8384XA Pain from genitourinary prosthetic devices, implants and grafts, initial encounter: Secondary | ICD-10-CM | POA: Insufficient documentation

## 2020-11-11 DIAGNOSIS — N8312 Corpus luteum cyst of left ovary: Secondary | ICD-10-CM | POA: Diagnosis not present

## 2020-11-18 ENCOUNTER — Ambulatory Visit (INDEPENDENT_AMBULATORY_CARE_PROVIDER_SITE_OTHER): Payer: 59 | Admitting: Obstetrics and Gynecology

## 2020-11-18 ENCOUNTER — Other Ambulatory Visit: Payer: Self-pay

## 2020-11-18 ENCOUNTER — Encounter: Payer: Self-pay | Admitting: Obstetrics and Gynecology

## 2020-11-18 VITALS — BP 128/74 | Ht 64.0 in | Wt 269.8 lb

## 2020-11-18 DIAGNOSIS — T8384XD Pain from genitourinary prosthetic devices, implants and grafts, subsequent encounter: Secondary | ICD-10-CM

## 2020-11-18 DIAGNOSIS — Z30011 Encounter for initial prescription of contraceptive pills: Secondary | ICD-10-CM | POA: Diagnosis not present

## 2020-11-18 MED ORDER — NORETHINDRONE ACET-ETHINYL EST 1.5-30 MG-MCG PO TABS: 1.0000 | ORAL_TABLET | Freq: Every day | ORAL | 11 refills | Status: DC

## 2020-11-18 MED ORDER — NORETHIN ACE-ETH ESTRAD-FE 1-20 MG-MCG PO TABS: 1.0000 | ORAL_TABLET | Freq: Every day | ORAL | 11 refills | Status: DC

## 2020-11-18 NOTE — Patient Instructions (Signed)
Hormonal Contraception Information °Hormonal contraception is a type of birth control that uses hormones to prevent pregnancy. It usually involves a combination of the hormones estrogen and progesterone, or only the hormone progesterone. Hormonal contraception works in these ways: °It thickens the mucus in the cervix, which is the lowest part of the uterus. Thicker mucus makes it harder for sperm to enter the uterus. °It changes the lining of the uterus. This makes it harder for an egg to attach or implant. °It may stop the ovaries from releasing eggs (ovulation). Some women who take hormonal contraceptives that contain only progesterone may continue to ovulate. °Hormonal contraception cannot prevent STIs (sexually transmitted infections). Pregnancy may still occur. °Types of hormonal contraception °Estrogen and progesterone contraceptives °Contraceptives that use a combination of estrogen and progesterone are available in these forms: °Pill. Pills come in different combinations of hormones. Pills must be taken at the same time each day. They can affect your period. You can get your period monthly, once every 3 months, or not at all. °Patch. The patch is applied to the buttocks, abdomen, upper outer arm, or back. It is kept in place for 3 weeks. It is removed for the last or fourth week of the cycle. °Vaginal ring. The ring is placed in the vagina and left there for 3 weeks. It is then removed for the last or fourth week of the cycle. °Progesterone-only contraceptives °Contraceptives that use only progesterone are available in these forms: °Pill. Pills should be taken at the same time everyday. This is very important to decrease the chance of pregnancy. Pills containing progestin-only are usually taken every day of the cycle. Other types of pills may have a placebo tablet for the last 4 days of every cycle. °Intrauterine device (IUD). This device is inserted through the vagina and cervix into the uterus. It is  removed or replaced every 3 to 5 years, depending on the type. It can be removed sooner. °Implant. Plastic rods are placed under the skin of the upper arm. They are removed or replaced every 3 years. They can be removed sooner. °Shot (injection). The injection is given once every 12 or 13 weeks (about 3 months). °Risks associated with hormonal contraception °Estrogen and progesterone contraceptives can sometimes cause side effects, such as: °Nausea. °Headaches. °Breast tenderness. °Bleeding or spotting between menstrual cycles. °High blood pressure (rare). °Strokes, heart attacks, or blood clots (rare). °Progesterone-only contraceptives also can have side effects, such as: °Nausea. °Headaches. °Breast tenderness. °Irregular menstrual bleeding. °High blood pressure (rare). °Talk to your health care provider about what side effects may mean for you. °Questions to ask: °What type of hormonal contraception is right for me? °How long should I plan to use hormonal contraception? °What are the side effects of the hormonal contraception method I choose? °How can I prevent STIs while using hormonal contraception? °Where to find more information °Ask your health care provider for more information and resources about hormonal contraception. You can also go to: °[place] Department of Health and Human Services, Office on Women's Health: www.womenshealth.gov °Summary °Estrogen and progesterone are hormones used in many forms of birth control. °Hormonal contraception cannot prevent STIs (sexually transmitted infections). °Talk to your health care provider about what side effects may mean for you. °Ask your health care provider for more information and resources about hormonal contraception. °This information is not intended to replace advice given to you by your health care provider. Make sure you discuss any questions you have with your health care provider. °Document   Revised: 11/28/2019 Document Reviewed: 11/28/2019 °Elsevier  Patient Education © 2022 Elsevier Inc. ° °

## 2020-11-18 NOTE — Progress Notes (Signed)
Patient ID: Amanda Buck, female   DOB: [birth year], 25 y.o.   MRN: [id number]  Reason for Consult: Gynecologic Exam   Referred by No ref. provider found  Subjective:     HPI:  Amanda Buck is a 25 y.o. female she reports that she is continue to have some lower pelvic discomfort.  She did not take her complete the oral antibiotics which were previously prescribed.  She underwent a pelvic ultrasound which did not show any concerning findings or source for her pelvic pain.  She feels like she would like to have her IUD removed and transition to an oral medicine to see if this improves her pain or discomfort. Nuswab did not show an infection.   She is considered other methods such as Nexplanon or Depo-Provera.  She reports that she is not interested in Nexplanon and that she does not like injections so Depo-Provera is not an ideal choice for her.   Past Medical History:  Diagnosis Date   Anemia affecting pregnancy    Asthma    No current treatment needed   Headache(784.0)    Obesity (BMI 35.0-39.9 without comorbidity) 05/31/2018   BMI 38.28 kg/m2   Obesity affecting pregnancy in third trimester, antepartum    Family History  Problem Relation Age of Onset   ALS Paternal Grandmother        Died at 36   Heart failure Paternal Grandfather        Died at 32   Past Surgical History:  Procedure Laterality Date   LAPAROSCOPIC APPENDECTOMY N/A 12/28/2016   Procedure: APPENDECTOMY LAPAROSCOPIC;  Surgeon: Berna Bue, MD;  Location: [facility];  Service: General;  Laterality: N/A;    Short Social History:  Social History   Tobacco Use   Smoking status: Never   Smokeless tobacco: Never  Substance Use Topics   Alcohol use: No    Allergies  Allergen Reactions   Vicodin [Hydrocodone-Acetaminophen] Rash    Current Outpatient Medications  Medication Sig Dispense Refill   aspirin-acetaminophen-caffeine (EXCEDRIN MIGRAINE) 250-250-65 MG tablet Take by mouth every 6 (six) hours  as needed for headache.     levonorgestrel (MIRENA) 20 MCG/24HR IUD 1 each by Intrauterine route once.      ondansetron (ZOFRAN ODT) 4 MG disintegrating tablet Take 1 tablet (4 mg total) by mouth every 8 (eight) hours as needed for nausea or vomiting. (Patient not taking: No sig reported) 20 tablet 5   topiramate (TOPAMAX) 50 MG tablet Take 1 tablet (50 mg total) by mouth at bedtime. 30 tablet 5   No current facility-administered medications for this visit.    Review of Systems  Constitutional: Negative for chills, fatigue, fever and unexpected weight change.  HENT: Negative for trouble swallowing.  Eyes: Negative for loss of vision.  Respiratory: Negative for cough, shortness of breath and wheezing.  Cardiovascular: Negative for chest pain, leg swelling, palpitations and syncope.  GI: Negative for abdominal pain, blood in stool, diarrhea, nausea and vomiting.  GU: Negative for difficulty urinating, dysuria, frequency and hematuria.  Musculoskeletal: Negative for back pain, leg pain and joint pain.  Skin: Negative for rash.  Neurological: Negative for dizziness, headaches, light-headedness, numbness and seizures.  Psychiatric: Negative for behavioral problem, confusion, depressed mood and sleep disturbance.       Objective:  Objective   Vitals:   11/18/20 1411  BP: 128/74  Weight: 269 lb 12.8 oz (122.4 kg)  Height: 5\' 4"  (1.626 m)   Body mass index is  46.31 kg/m.  Physical Exam Vitals and nursing note reviewed. Exam conducted with a chaperone present.  Constitutional:      Appearance: Normal appearance.  HENT:     Head: Normocephalic and atraumatic.  Eyes:     Extraocular Movements: Extraocular movements intact.     Pupils: Pupils are equal, round, and reactive to light.  Cardiovascular:     Rate and Rhythm: Normal rate and regular rhythm.  Pulmonary:     Effort: Pulmonary effort is normal.     Breath sounds: Normal breath sounds.  Abdominal:     General: Abdomen is  flat.     Palpations: Abdomen is soft.  Musculoskeletal:     Cervical back: Normal range of motion.  Skin:    General: Skin is warm and dry.  Neurological:     General: No focal deficit present.     Mental Status: She is alert and oriented to person, place, and time.  Psychiatric:        Behavior: Behavior normal.        Thought Content: Thought content normal.        Judgment: Judgment normal.    Assessment/Plan:     25 yo here for birth control counseling.  We discussed that since she is on Topamax she will have lower efficacy of oral contraceptive pill.  She strongly desires to avoid pregnancy.  We discussed contraceptive changing overlap with starting a oral contraceptive pill 1 week before  removing the IUD.  We also discussed layering her contraceptive options since she will be using a left is effective oral contraception with condoms withdrawal or phexxi.  She will consider if she wants a prescription for Phexxi.  Birth control prescription sent to her pharmacy.  She will plan to follow-up in 1 week for IUD removal if desired.  More than 20 minutes were spent face to face with the patient in the room, reviewing the medical record, labs and images, and coordinating care for the patient. The plan of management was discussed in detail and counseling was provided.     Adelene Idler MD [facility], [facility] 11/18/2020 2:40 PM

## 2020-11-24 ENCOUNTER — Ambulatory Visit (INDEPENDENT_AMBULATORY_CARE_PROVIDER_SITE_OTHER): Payer: 59 | Admitting: Obstetrics and Gynecology

## 2020-11-24 ENCOUNTER — Encounter: Payer: Self-pay | Admitting: Obstetrics and Gynecology

## 2020-11-24 ENCOUNTER — Other Ambulatory Visit: Payer: Self-pay

## 2020-11-24 VITALS — BP 120/74 | Ht 64.0 in | Wt 267.8 lb

## 2020-11-24 DIAGNOSIS — Z30432 Encounter for removal of intrauterine contraceptive device: Secondary | ICD-10-CM

## 2020-11-24 NOTE — Progress Notes (Signed)
Patient ID: Amanda Buck, female   DOB: 08/16/1995, 25 y.o.   MRN: [id number]  Reason for Consult: Gynecologic Exam   Referred by No ref. provider found  Subjective:     HPI:  Amanda Buck is a 25 y.o. female. She would like her IUD to be removed. She has started an OCP. She is going to consider switching off of topamax and discuss with her other provide who she is seeing soon. She   Past Medical History:  Diagnosis Date   Anemia affecting pregnancy    Asthma    No current treatment needed   Headache(784.0)    Obesity (BMI 35.0-39.9 without comorbidity) 05/31/2018   BMI 38.28 kg/m2   Obesity affecting pregnancy in third trimester, antepartum    Family History  Problem Relation Age of Onset   ALS Paternal Grandmother        Died at 27   Heart failure Paternal Grandfather        Died at 3   Past Surgical History:  Procedure Laterality Date   LAPAROSCOPIC APPENDECTOMY N/A 12/28/2016   Procedure: APPENDECTOMY LAPAROSCOPIC;  Surgeon: Berna Bue, MD;  Location: [facility];  Service: General;  Laterality: N/A;    Short Social History:  Social History   Tobacco Use   Smoking status: Never   Smokeless tobacco: Never  Substance Use Topics   Alcohol use: No    Allergies  Allergen Reactions   Vicodin [Hydrocodone-Acetaminophen] Rash    Current Outpatient Medications  Medication Sig Dispense Refill   aspirin-acetaminophen-caffeine (EXCEDRIN MIGRAINE) 250-250-65 MG tablet Take by mouth every 6 (six) hours as needed for headache.     Norethindrone Acetate-Ethinyl Estradiol (JUNEL 1.5/30) 1.5-30 MG-MCG tablet Take 1 tablet by mouth daily. 28 tablet 11   topiramate (TOPAMAX) 50 MG tablet Take 1 tablet (50 mg total) by mouth at bedtime. 30 tablet 5   levonorgestrel (MIRENA) 20 MCG/24HR IUD 1 each by Intrauterine route once.      ondansetron (ZOFRAN ODT) 4 MG disintegrating tablet Take 1 tablet (4 mg total) by mouth every 8 (eight) hours as needed for nausea or vomiting.  (Patient not taking: No sig reported) 20 tablet 5   No current facility-administered medications for this visit.    Review of Systems  Constitutional: Negative for chills, fatigue, fever and unexpected weight change.  HENT: Negative for trouble swallowing.  Eyes: Negative for loss of vision.  Respiratory: Negative for cough, shortness of breath and wheezing.  Cardiovascular: Negative for chest pain, leg swelling, palpitations and syncope.  GI: Negative for abdominal pain, blood in stool, diarrhea, nausea and vomiting.  GU: Negative for difficulty urinating, dysuria, frequency and hematuria.  Musculoskeletal: Negative for back pain, leg pain and joint pain.  Skin: Negative for rash.  Neurological: Negative for dizziness, headaches, light-headedness, numbness and seizures.  Psychiatric: Negative for behavioral problem, confusion, depressed mood and sleep disturbance.       Objective:  Objective   Vitals:   11/24/20 0806  BP: 120/74  Weight: 267 lb 12.8 oz (121.5 kg)  Height: 5\' 4"  (1.626 m)   Body mass index is 45.97 kg/m.  Physical Exam Vitals and nursing note reviewed. Exam conducted with a chaperone present.  Constitutional:      Appearance: Normal appearance. She is well-developed.  HENT:     Head: Normocephalic and atraumatic.  Eyes:     Extraocular Movements: Extraocular movements intact.     Pupils: Pupils are equal, round, and reactive to light.  Cardiovascular:     Rate and Rhythm: Normal rate and regular rhythm.  Pulmonary:     Effort: Pulmonary effort is normal. No respiratory distress.     Breath sounds: Normal breath sounds.  Abdominal:     General: Abdomen is flat.     Palpations: Abdomen is soft.  Genitourinary:    Comments: External: Normal appearing vulva. No lesions noted.  Speculum examination: Normal appearing cervix. No blood in the vaginal vault. NO discharge.   IUD strings seen, IUD removed Musculoskeletal:        General: No signs of injury.   Skin:    General: Skin is warm and dry.  Neurological:     Mental Status: She is alert and oriented to person, place, and time.  Psychiatric:        Behavior: Behavior normal.        Thought Content: Thought content normal.        Judgment: Judgment normal.   IUD Removal Strings of IUD identified and grasped.  IUD removed without problem.  Pt tolerated this well.  IUD noted to be intact.  Assessment/Plan:     IUD Removal IUD removed and plan for contraception is oral contraceptives (estrogen/progesterone) and condoms. She was amenable to this plan.  More than 10 minutes were spent face to face with the patient in the room, reviewing the medical record, labs and images, and coordinating care for the patient. The plan of management was discussed in detail and counseling was provided.    Adelene Idler MD [facility], [facility] 11/24/2020 8:16 AM

## 2021-04-05 NOTE — L&D Delivery Note (Signed)
       Delivery Note   Amanda Buck is a 26 y.o. G2P1001 at [redacted]w[redacted]d Estimated Date of Delivery: 10/30/21  PRE-OPERATIVE DIAGNOSIS:  1) [redacted]w[redacted]d pregnancy.    POST-OPERATIVE DIAGNOSIS:  1) [redacted]w[redacted]d pregnancy s/p Vaginal, Spontaneous   Delivery Type: Vaginal, Spontaneous    Delivery Anesthesia: None   Labor Complications:  precipitous delivery    ESTIMATED BLOOD LOSS: 250 ml    FINDINGS:   1) female infant, Apgar scores of    8 at 1 minute and    9 at 5 minutes and a birthweight pending.   2) Nuchal cord: no  SPECIMENS:   PLACENTA:   Appearance: Intact , 3 vessel   Removal: Spontaneous      Disposition:  per protocol   DISPOSITION:  Infant to left in stable condition in the delivery room, with L&D personnel and mother,  NARRATIVE SUMMARY: Labor course:  Ms. Amanda Buck is a G2P1001 at [redacted]w[redacted]d who presented for labor management.  She progressed well in labor without pitocin.  She received the no anesthesia and proceeded to complete dilation. She evidenced good maternal expulsive effort during the second stage. She went on to deliver a viable female infant "Ellsworth Lennox". The placenta delivered without problems and was noted to be complete. A perineal and vaginal examination was performed. Episiotomy/Lacerations:  none. The patient tolerated this well.  Doreene Burke, CNM  10/23/2021 1:12 AM

## 2021-04-24 ENCOUNTER — Ambulatory Visit: Payer: Self-pay | Admitting: Physician Assistant

## 2021-04-24 ENCOUNTER — Ambulatory Visit: Payer: 59 | Admitting: Neurology

## 2021-04-24 DIAGNOSIS — Z029 Encounter for administrative examinations, unspecified: Secondary | ICD-10-CM

## 2021-04-24 NOTE — Progress Notes (Incomplete)
NEUROLOGY FOLLOW UP OFFICE NOTE  ROSENA ZENO [id number]  Assessment/Plan:   Migraine without aura, without status migrainosus, not intractable  1.  Migraine prevention:  topiramate 50mg  at bedtime.  Consider setting a reminder on her phone 2.  Migraine rescue:  Excedrin Extra-strength 3.  Limit use of pain relievers to no more than 2 days out of week to prevent risk of rebound or medication-overuse headache. 4.  Keep headache diary 5.  Follow up 1 year with Dr. Tomi Likens   Subjective:  Amanda Buck is a 26 year old right-handed Caucasian female who follows up for migraines.   UPDATE: Sometimes forgets to take the topiramate even though it is sitting at her bathroom sink.   Intensity:  moderate Duration:  30 to 40 minutes Frequency:  3 to 4 days a month Current NSAIDS:  ibuprofen Current analgesics:  Excedrin Extra-strength Current triptans:  none Current ergotamine:  none Current anti-emetic:  Zofran 4mg  Current muscle relaxants:  none Current anti-anxiolytic:  none Current sleep aide:  none Current Antihypertensive medications:  none Current Antidepressant medications:  none Current Anticonvulsant medications:  topiramate 50mg  at bedtime Current anti-CGRP:  none Current Vitamins/Herbal/Supplements:  none Current Antihistamines/Decongestants:  none Other therapy:  none Hormone/birth control:  Mirena   Caffeine:  No coffee.  Cut down on [place] Diet:  Trying to increase water intake (64 oz daily).  Trying to eat a muffin for breakfast.  1 gatorade a day  Exercise:  gym twice a week Depression:  no; Anxiety:  Yes (work, home) Other pain:  Some right hip pain Sleep:  Poor.  Tosses and turns.  Her daughter may keep her up.     HISTORY:  Migraines started in highschool.  Migraines are severe pounding right temporal with nausea, photophobia, phonophobia and sometimes vomits.  No visual disturbance, numbness or weakness.  They may wake her up at night.  May last up to 2  days.  They typically occur once a week.  Unknown triggers.  Sometimes laying down may help.  She notes that her eyesight isn't as good as it used to be.  No recent eye exams.  No visual obscurations or pulsatile tinnitus.     She also has near-daily dull diffuse headache as well.     Past NSAIDS:  none Past analgesics:  acetaminophen Past abortive triptans:  sumatriptan, rizatriptan Past abortive ergotamine:  none Past muscle relaxants:  none Past anti-emetic:  none Past antihypertensive medications:  none Past antidepressant medications:  none Past anticonvulsant medications:  none Past anti-CGRP:  none Past vitamins/Herbal/Supplements:  none Other past therapies:  none     Family history of headache:  Brothers and sisters  PAST MEDICAL HISTORY: Past Medical History:  Diagnosis Date   Anemia affecting pregnancy    Asthma    No current treatment needed   Headache(784.0)    Obesity (BMI 35.0-39.9 without comorbidity) 05/31/2018   BMI 38.28 kg/m2   Obesity affecting pregnancy in third trimester, antepartum     MEDICATIONS: Current Outpatient Medications on File Prior to Visit  Medication Sig Dispense Refill   aspirin-acetaminophen-caffeine (EXCEDRIN MIGRAINE) 250-250-65 MG tablet Take by mouth every 6 (six) hours as needed for headache.     levonorgestrel (MIRENA) 20 MCG/24HR IUD 1 each by Intrauterine route once.      Norethindrone Acetate-Ethinyl Estradiol (JUNEL 1.5/30) 1.5-30 MG-MCG tablet Take 1 tablet by mouth daily. 28 tablet 11   ondansetron (ZOFRAN ODT) 4 MG disintegrating tablet Take 1 tablet (  4 mg total) by mouth every 8 (eight) hours as needed for nausea or vomiting. (Patient not taking: No sig reported) 20 tablet 5   topiramate (TOPAMAX) 50 MG tablet Take 1 tablet (50 mg total) by mouth at bedtime. 30 tablet 5   No current facility-administered medications on file prior to visit.    ALLERGIES: Allergies  Allergen Reactions   Vicodin  [Hydrocodone-Acetaminophen] Rash    FAMILY HISTORY: Family History  Problem Relation Age of Onset   ALS Paternal Grandmother        Died at 54   Heart failure Paternal Grandfather        Died at 81      Objective:  Blood pressure 112/70, pulse 72, height 5\' 5"  (1.651 m), weight 270 lb (122.5 kg), SpO2 100 %, unknown if currently breastfeeding. General: No acute distress.  Patient appears well-groomed.   Head:  Normocephalic/atraumatic Eyes:  Fundi examined but not visualized Neck: supple, no paraspinal tenderness, full range of motion Heart:  Regular rate and rhythm Lungs:  Clear to auscultation bilaterally Back: No paraspinal tenderness Neurological Exam: alert and oriented to person, place, and time. Speech fluent and not dysarthric, language intact.  CN II-XII intact. Bulk and tone normal, muscle strength 5/5 throughout.  Sensation to light touch intact.  Deep tendon reflexes 2+ throughout.  Finger to nose and heel to shin testing intact.  Gait normal, Romberg negative.   Sharene Butters, PA-C

## 2021-05-19 ENCOUNTER — Other Ambulatory Visit: Payer: Self-pay

## 2021-05-19 ENCOUNTER — Ambulatory Visit (INDEPENDENT_AMBULATORY_CARE_PROVIDER_SITE_OTHER): Payer: Self-pay | Admitting: Obstetrics and Gynecology

## 2021-05-19 ENCOUNTER — Encounter: Payer: Self-pay | Admitting: Obstetrics and Gynecology

## 2021-05-19 VITALS — BP 120/80 | Ht 64.0 in | Wt 245.0 lb

## 2021-05-19 DIAGNOSIS — N898 Other specified noninflammatory disorders of vagina: Secondary | ICD-10-CM

## 2021-05-19 DIAGNOSIS — Z3201 Encounter for pregnancy test, result positive: Secondary | ICD-10-CM

## 2021-05-19 DIAGNOSIS — R300 Dysuria: Secondary | ICD-10-CM

## 2021-05-19 LAB — POCT WET PREP WITH KOH
Clue Cells Wet Prep HPF POC: NEGATIVE
KOH Prep POC: NEGATIVE
Trichomonas, UA: NEGATIVE
Yeast Wet Prep HPF POC: NEGATIVE

## 2021-05-19 LAB — POCT URINALYSIS DIPSTICK
Bilirubin, UA: NEGATIVE
Blood, UA: NEGATIVE
Glucose, UA: NEGATIVE
Ketones, UA: NEGATIVE
Leukocytes, UA: NEGATIVE
Nitrite, UA: NEGATIVE
Protein, UA: NEGATIVE
Spec Grav, UA: 1.02 (ref 1.010–1.025)
pH, UA: 5 (ref 5.0–8.0)

## 2021-05-19 LAB — POCT URINE PREGNANCY: Preg Test, Ur: POSITIVE — AB

## 2021-05-19 NOTE — Patient Instructions (Signed)
I value your feedback and you entrusting us with your care. If you get a [facility] patient survey, I would appreciate you taking the time to let us know about your experience today. Thank you! ? ? ?

## 2021-05-19 NOTE — Progress Notes (Signed)
Patient, No Pcp Per (Inactive)   Chief Complaint  Patient presents with   Vaginal Discharge    No itchiness or odor x couple of weeks   Urinary Tract Infection    Frequency, no burning x couple of weeks. Pt would like UPT    HPI:      Ms. MAO HAMON is a 26 y.o. G1P1001 whose LMP was Patient's last menstrual period was 03/20/2021 (approximate)., presents today for increased vag d/c without itching/irritation/fishy odor, for a few wks. Also with a little vaginal discomfort after urination. No frequency/urgency/hematuria/LBP/pelvic pain/fevers. She drinks some water but not a lot. She is sex active, no new partners.   She had IUD removed 8/22, did OCPs a couple months, but hasn't been on [facility] since probably Sept or Oct. Was using condoms sometimes. Pt thinks LMP was either 11/22 or 12/22. Has been having nausea/vomiting for a couple months, no breast tenderness. Not taking PNVs.   Patient Active Problem List   Diagnosis Date Noted   Obesity (BMI 30-39.9) 05/31/2018   Asthma 05/31/2018   Appendicitis 12/27/2016   Acute appendicitis 12/27/2016    Past Surgical History:  Procedure Laterality Date   LAPAROSCOPIC APPENDECTOMY N/A 12/28/2016   Procedure: APPENDECTOMY LAPAROSCOPIC;  Surgeon: Clovis Riley, MD;  Location: [facility];  Service: General;  Laterality: N/A;    Family History  Problem Relation Age of Onset   ALS Paternal 27        Died at 90   Heart failure Paternal Grandfather        Died at 64    Social History   Socioeconomic History   Marital status: Single    Spouse name: Not on file   Number of children: 1   Years of education: Not on file   Highest education level: Not on file  Occupational History   Occupation: Museum/gallery curator: [employer]  Tobacco Use   Smoking status: Never   Smokeless tobacco: Never  Vaping Use   Vaping Use: Never used  Substance and Sexual Activity   Alcohol use: No   Drug use: No   Sexual activity:  Yes    Partners: Male    Birth control/protection: None, Condom  Other Topics Concern   Not on file  Social History Narrative   Right handed   Lives with family one story home   Social Determinants of Health   Financial Resource Strain: Not on file  Food Insecurity: Not on file  Transportation Needs: Not on file  Physical Activity: Not on file  Stress: Not on file  Social Connections: Not on file  Intimate Partner Violence: Not on file    Outpatient Medications Prior to Visit  Medication Sig Dispense Refill   levonorgestrel (MIRENA) 20 MCG/24HR IUD 1 each by Intrauterine route once.      Norethindrone Acetate-Ethinyl Estradiol (JUNEL 1.5/30) 1.5-30 MG-MCG tablet Take 1 tablet by mouth daily. (Patient not taking: Reported on 05/19/2021) 28 tablet 11   aspirin-acetaminophen-caffeine (EXCEDRIN MIGRAINE) 250-250-65 MG tablet Take by mouth every 6 (six) hours as needed for headache.     ondansetron (ZOFRAN ODT) 4 MG disintegrating tablet Take 1 tablet (4 mg total) by mouth every 8 (eight) hours as needed for nausea or vomiting. (Patient not taking: No sig reported) 20 tablet 5   topiramate (TOPAMAX) 50 MG tablet Take 1 tablet (50 mg total) by mouth at bedtime. 30 tablet 5   No facility-administered medications prior to visit.  ROS:  Review of Systems  Constitutional:  Negative for fever.  Gastrointestinal:  Negative for blood in stool, constipation, diarrhea, nausea and vomiting.  Genitourinary:  Positive for dysuria and vaginal discharge. Negative for dyspareunia, flank pain, frequency, hematuria, urgency, vaginal bleeding and vaginal pain.  Musculoskeletal:  Negative for back pain.  Skin:  Negative for rash.  BREAST: No symptoms   OBJECTIVE:   Vitals:  BP 120/80    Ht 5\' 4"  (1.626 m)    Wt 245 lb (111.1 kg)    LMP 03/20/2021 (Approximate)    Breastfeeding No    BMI 42.05 kg/m   Physical Exam Vitals reviewed.  Constitutional:      Appearance: She is well-developed.   Pulmonary:     Effort: Pulmonary effort is normal.  Genitourinary:    General: Normal vulva.     Pubic Area: No rash.      Labia:        Right: No rash, tenderness or lesion.        Left: No rash, tenderness or lesion.      Vagina: Vaginal discharge present. No erythema or tenderness.     Cervix: Normal.     Uterus: Normal. Enlarged. Not tender.      Adnexa: Right adnexa normal and left adnexa normal.       Right: No mass or tenderness.         Left: No mass or tenderness.    Musculoskeletal:        General: Normal range of motion.     Cervical back: Normal range of motion.  Skin:    General: Skin is warm and dry.  Neurological:     General: No focal deficit present.     Mental Status: She is alert and oriented to person, place, and time.  Psychiatric:        Mood and Affect: Mood normal.        Behavior: Behavior normal.        Thought Content: Thought content normal.        Judgment: Judgment normal.    Results: Results for orders placed or performed in visit on 05/19/21 (from the past 24 hour(s))  POCT urine pregnancy     Status: Abnormal   Collection Time: 05/19/21  9:03 AM  Result Value Ref Range   Preg Test, Ur Positive (A) Negative  POCT Urinalysis Dipstick     Status: Normal   Collection Time: 05/19/21  9:22 AM  Result Value Ref Range   Color, UA amber    Clarity, UA clear    Glucose, UA Negative Negative   Bilirubin, UA neg    Ketones, UA neg    Spec Grav, UA 1.020 1.010 - 1.025   Blood, UA neg    pH, UA 5.0 5.0 - 8.0   Protein, UA Negative Negative   Urobilinogen, UA     Nitrite, UA neg    Leukocytes, UA Negative Negative   Appearance     Odor    POCT Wet Prep with KOH     Status: Normal   Collection Time: 05/19/21  9:22 AM  Result Value Ref Range   Trichomonas, UA Negative    Clue Cells Wet Prep HPF POC neg    Epithelial Wet Prep HPF POC     Yeast Wet Prep HPF POC neg    Bacteria Wet Prep HPF POC     RBC Wet Prep HPF POC     WBC Wet Prep HPF  POC     KOH Prep POC Negative Negative     Assessment/Plan: Vaginal discharge - Plan: POCT Wet Prep with KOH; neg wet prep, normal d/c on exam; no new partners. Most likely due to pregnancy. Reassurance. F/u prn.   Dysuria - Plan: Urine Culture, POCT Urinalysis Dipstick; neg UA. Check C&S. If neg, most likely due to dehydration and concentrated urine. Increase water. F/u prn.   Positive pregnancy test - Plan: POCT urine pregnancy; Pt is 8-[redacted] wks pregnant. Start PNVs, RTO for NOB/needs dating scan for EDD.   PT IS SELF PAY   Return for NOB appt ASAP; cancel 4/23 annual with CS.  Laveyah Oriol B. Riker Collier, PA-C 05/19/2021 9:25 AM

## 2021-05-20 ENCOUNTER — Other Ambulatory Visit (HOSPITAL_COMMUNITY)
Admission: RE | Admit: 2021-05-20 | Discharge: 2021-05-20 | Disposition: A | Discharge status: Home / Self Care | Payer: Medicaid Other | Source: Ambulatory Visit | Attending: Advanced Practice Midwife | Admitting: Advanced Practice Midwife

## 2021-05-20 ENCOUNTER — Ambulatory Visit (INDEPENDENT_AMBULATORY_CARE_PROVIDER_SITE_OTHER): Payer: Self-pay | Admitting: Advanced Practice Midwife

## 2021-05-20 ENCOUNTER — Encounter: Payer: Self-pay | Admitting: Advanced Practice Midwife

## 2021-05-20 VITALS — BP 122/83 | Wt 245.0 lb

## 2021-05-20 DIAGNOSIS — Z130 Encounter for screening for diseases of the blood and blood-forming organs and certain disorders involving the immune mechanism: Secondary | ICD-10-CM

## 2021-05-20 DIAGNOSIS — Z131 Encounter for screening for diabetes mellitus: Secondary | ICD-10-CM | POA: Insufficient documentation

## 2021-05-20 DIAGNOSIS — Z113 Encounter for screening for infections with a predominantly sexual mode of transmission: Secondary | ICD-10-CM | POA: Insufficient documentation

## 2021-05-20 DIAGNOSIS — Z789 Other specified health status: Secondary | ICD-10-CM

## 2021-05-20 DIAGNOSIS — Z369 Encounter for antenatal screening, unspecified: Secondary | ICD-10-CM | POA: Diagnosis present

## 2021-05-20 DIAGNOSIS — O99210 Obesity complicating pregnancy, unspecified trimester: Secondary | ICD-10-CM | POA: Insufficient documentation

## 2021-05-20 DIAGNOSIS — Z124 Encounter for screening for malignant neoplasm of cervix: Secondary | ICD-10-CM

## 2021-05-20 DIAGNOSIS — O0990 Supervision of high risk pregnancy, unspecified, unspecified trimester: Secondary | ICD-10-CM | POA: Insufficient documentation

## 2021-05-20 DIAGNOSIS — Z3490 Encounter for supervision of normal pregnancy, unspecified, unspecified trimester: Secondary | ICD-10-CM | POA: Insufficient documentation

## 2021-05-20 DIAGNOSIS — O99211 Obesity complicating pregnancy, first trimester: Secondary | ICD-10-CM

## 2021-05-20 DIAGNOSIS — Z3481 Encounter for supervision of other normal pregnancy, first trimester: Secondary | ICD-10-CM

## 2021-05-20 NOTE — Progress Notes (Signed)
NOB today. LMP not certain. Maybe 03/20/2021

## 2021-05-20 NOTE — Progress Notes (Signed)
New Obstetric Patient H&P    Chief Complaint: "Desires prenatal care"   History of Present Illness: Patient is a 26 y.o. G2P1001 Not Hispanic or Latino female, presents with amenorrhea and positive home pregnancy test. Patient's last menstrual period was 02/18/2021 (within weeks). and based on her  LMP, her EDD is Estimated Date of Delivery: 11/25/21 and her EGA is [redacted]w[redacted]d. Cycles are 3-4 days, irregular, and occur approximately every : 1-2 months. Her last pap smear was 3 years ago and was no abnormalities.    She had a urine pregnancy test which was positive 1 day(s)  ago. Her last menstrual period was normal and lasted for  3 or 4 day(s). Since her LMP she claims she has experienced nausea and vomiting. She has had recent headaches and denies history of menstrual migraine. She denies vaginal bleeding. Her past medical history is noncontributory. Her prior pregnancies are notable for  7#6oz FT SVD.  Since her LMP, she admits to the use of tobacco products  no She claims she has lost  15  pounds since the start of her pregnancy.  There are cats in the home in the home  no  She admits close contact with children on a regular basis  yes  She has had chicken pox in the past yes She has had Tuberculosis exposures, symptoms, or previously tested positive for TB   no Current or past history of domestic violence. no  Genetic Screening/Teratology Counseling: (Includes patient, baby's father, or anyone in either family with:)   1. Patient's age >/= 68 [facility]  no 2. Thalassemia ([place], [place], Mediterranean, or Asian background): MCV<80  no 3. Neural tube defect (meningomyelocele, spina bifida, anencephaly)  no 4. Congenital heart defect  no  5. Down syndrome  no 6. Tay-Sachs (Jewish, [place]  no 7. Canavan's Disease  no 8. Sickle cell disease or trait (African)  no  9. Hemophilia or other blood disorders  no  10. Muscular dystrophy  no  11. Cystic fibrosis  no  12. Huntington's  Chorea  no  13. Mental retardation/autism  no 14. Other inherited genetic or chromosomal disorder  no 15. Maternal metabolic disorder (DM, PKU, etc)  no 16. Patient or FOB with a child with a birth defect not listed above no  16a. Patient or FOB with a birth defect themselves no 17. Recurrent pregnancy loss, or stillbirth  no  18. Any medications since LMP other than prenatal vitamins (include vitamins, supplements, OTC meds, drugs, alcohol)  no 19. 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 Lives with someone with TB or TB exposed  no  2. Patient or partner has history of genital herpes  no 3. Rash or viral illness since LMP  no 4. History of STI (GC, CT, HPV, syphilis, HIV)  no 5. History of recent travel :  no  Other pertinent information:  no    Review of Systems:10 point review of systems negative unless otherwise noted in HPI  Past Medical History:  Patient Active Problem List   Diagnosis Date Noted   Supervision of normal pregnancy 05/20/2021     Nursing Staff Provider  Office Location  [facility]    Language  English Anatomy US    Flu Vaccine   Genetic Screen  NIPS:   TDaP vaccine    Hgb A1C or  GTT Early : Third trimester :  Covid    LAB RESULTS   Rhogam  A Pos Blood Type     Feeding Plan ? Antibody    Contraception  Rubella    Circumcision  RPR Non Reactive (04/12 0924)   Pediatrician   HBsAg Negative (04/12 0924)   Support Person Onalee Hua HIV Non Reactive (04/12 0924)  Prenatal Classes  Varicella     GBS  (For PCN allergy, check sensitivities)   BTL Consent     VBAC Consent NA Pap  05/20/21    Hgb Electro    Pelvis Tested 7#6oz CF      SMA            Obesity affecting pregnancy 05/20/2021   Antenatal screening encounter 05/20/2021   Asthma 05/31/2018   Appendicitis 12/27/2016   Acute appendicitis 12/27/2016    Past Surgical History:  Past  Surgical History:  Procedure Laterality Date   LAPAROSCOPIC APPENDECTOMY N/A 12/28/2016   Procedure: APPENDECTOMY LAPAROSCOPIC;  Surgeon: Berna Bue, MD;  Location: [facility];  Service: General;  Laterality: N/A;    Gynecologic History: Patient's last menstrual period was 02/18/2021 (within weeks).  Obstetric History: G2P1001  Family History:  Family History  Problem Relation Age of Onset   ALS Paternal Grandmother        Died at 58   Heart failure Paternal Grandfather        Died at 43    Social History:  Social History   Socioeconomic History   Marital status: Single    Spouse name: Not on file   Number of children: 1   Years of education: Not on file   Highest education level: Not on file  Occupational History   Occupation: Nutritional therapist: [employer]  Tobacco Use   Smoking status: Never   Smokeless tobacco: Never  Vaping Use   Vaping Use: Never used  Substance and Sexual Activity   Alcohol use: No   Drug use: No   Sexual activity: Yes    Partners: Male    Birth control/protection: None  Other Topics Concern   Not on file  Social History Narrative   Right handed   Lives with family one story home   Social Determinants of Health   Financial Resource Strain: Not on file  Food Insecurity: Not on file  Transportation Needs: Not on file  Physical Activity: Not on file  Stress: Not on file  Social Connections: Not on file  Intimate Partner Violence: Not on file    Allergies:  Allergies  Allergen Reactions   Vicodin [Hydrocodone-Acetaminophen] Rash    Medications: Prior to Admission medications   Not on File    Physical Exam Vitals: Blood pressure 122/83, weight 245 lb (111.1 kg), last menstrual period 02/18/2021.  General: NAD HEENT: normocephalic, anicteric Thyroid: no enlargement, no palpable nodules Pulmonary: No increased work of breathing, CTAB Cardiovascular: RRR, distal pulses 2+ Abdomen: NABS, soft, non-tender,  non-distended.  Umbilicus without lesions.  No hepatomegaly, splenomegaly or masses palpable. No evidence of hernia. FHTs by doppler above pubis 140s  Genitourinary: limited by body habitus  External: Normal external female genitalia.  Normal urethral meatus, normal  Bartholin's and Skene's glands.    Vagina: Normal vaginal mucosa, no evidence of prolapse.    Cervix: unable to visualize without long Graves speculum available/PAP collected with digital exam. No blood on exam glove Extremities: no edema, erythema, or tenderness Neurologic: Grossly intact Psychiatric: mood appropriate, affect full   The following were addressed  during this visit:  Breastfeeding Education - Early initiation of breastfeeding    Comments: Keeps milk supply adequate, helps contract uterus and slow bleeding, and early milk is the perfect first food and is easy to digest.   - The importance of exclusive breastfeeding    Comments: Provides antibodies, Lower risk of breast and ovarian cancers, and type-2 diabetes,Helps your body recover, Reduced chance of SIDS.   - Risks of giving your baby anything other than breast milk if you are breastfeeding    Comments: Make the baby less content with breastfeeds, may make my baby more susceptible to illness, and may reduce my milk supply.   - The importance of early skin-to-skin contact    Comments:  Keeps baby warm and secure, helps keep babys blood sugar up and breathing steady, easier to bond and breastfeed, and helps calm baby.  - Rooming-in on a 24-hour basis    Comments: Easier to learn baby's feeding cues, easier to bond and get to know each other, and encourages milk production.   - Feeding on demand or baby-led feeding    Comments: Helps prevent breastfeeding complications, helps bring in good milk supply, prevents under or overfeeding, and helps baby feel content and satisfied   - Frequent feeding to help assure optimal milk production    Comments: Making  a full supply of milk requires frequent removal of milk from breasts, infant will eat 8-12 times in 24 hours, if separated from infant use breast massage, hand expression and/ or pumping to remove milk from breasts.   - Effective positioning and attachment    Comments: Helps my baby to get enough breast milk, helps to produce an adequate milk supply, and helps prevent nipple pain and damage   - Exclusive breastfeeding for the first 6 months    Comments: Builds a healthy milk supply and keeps it up, protects baby from sickness and disease, and breastmilk has everything your baby needs for the first 6 months.    Assessment: 26 y.o. G2P1001 at [redacted]w[redacted]d presenting to initiate prenatal care  Plan: 1) Avoid alcoholic beverages. 2) Patient encouraged not to smoke.  3) Discontinue the use of all non-medicinal drugs and chemicals.  4) Take prenatal vitamins daily.  5) Nutrition, food safety (fish, cheese advisories, and high nitrite foods) and exercise discussed. 6) Hospital and practice style discussed with cross coverage system.  7) Genetic Screening, such as with 1st Trimester Screening, cell free fetal DNA, AFP testing, and Ultrasound, as well as with amniocentesis and CVS as appropriate, is discussed with patient. At the conclusion of today's visit patient requested genetic testing 8) Patient is asked about travel to areas at risk for the Zika virus, and counseled to avoid travel and exposure to mosquitoes or sexual partners who may have themselves been exposed to the virus. Testing is discussed, and will be ordered as appropriate.  9) PAPtima, NOB panel, Hgb A1C today (urine culture collected yesterday) 10) Return to clinic in 1 week for dating scan and ROB   Tresea Mall, CNM [facility] 05/20/2021, 1:01 PM

## 2021-05-20 NOTE — Patient Instructions (Signed)

## 2021-05-21 LAB — URINE CULTURE

## 2021-05-22 LAB — CYTOLOGY - PAP
Chlamydia: NEGATIVE
Comment: NEGATIVE
Comment: NEGATIVE
Comment: NORMAL
Diagnosis: NEGATIVE
Neisseria Gonorrhea: NEGATIVE
Trichomonas: NEGATIVE

## 2021-06-04 ENCOUNTER — Other Ambulatory Visit: Payer: Self-pay

## 2021-06-04 ENCOUNTER — Other Ambulatory Visit: Payer: Self-pay | Admitting: Advanced Practice Midwife

## 2021-06-04 ENCOUNTER — Ambulatory Visit (INDEPENDENT_AMBULATORY_CARE_PROVIDER_SITE_OTHER): Payer: Medicaid Other

## 2021-06-04 ENCOUNTER — Encounter: Payer: Self-pay | Admitting: Licensed Practical Nurse

## 2021-06-04 ENCOUNTER — Ambulatory Visit (INDEPENDENT_AMBULATORY_CARE_PROVIDER_SITE_OTHER): Payer: Medicaid Other | Admitting: Licensed Practical Nurse

## 2021-06-04 VITALS — BP 126/80 | Wt 248.0 lb

## 2021-06-04 DIAGNOSIS — Z3480 Encounter for supervision of other normal pregnancy, unspecified trimester: Secondary | ICD-10-CM

## 2021-06-04 DIAGNOSIS — Z369 Encounter for antenatal screening, unspecified: Secondary | ICD-10-CM

## 2021-06-04 DIAGNOSIS — Z1379 Encounter for other screening for genetic and chromosomal anomalies: Secondary | ICD-10-CM

## 2021-06-04 DIAGNOSIS — Z131 Encounter for screening for diabetes mellitus: Secondary | ICD-10-CM | POA: Diagnosis not present

## 2021-06-04 DIAGNOSIS — Z3A18 18 weeks gestation of pregnancy: Secondary | ICD-10-CM

## 2021-06-04 DIAGNOSIS — Z3481 Encounter for supervision of other normal pregnancy, first trimester: Secondary | ICD-10-CM

## 2021-06-04 DIAGNOSIS — Z789 Other specified health status: Secondary | ICD-10-CM | POA: Diagnosis not present

## 2021-06-04 LAB — OB RESULTS CONSOLE VARICELLA ZOSTER ANTIBODY, IGG: Varicella: IMMUNE

## 2021-06-04 NOTE — Progress Notes (Signed)
Routine Prenatal Care Visit ? ?Subjective  ?Amanda Buck is a 26 y.o. G2P1001 at [redacted]w[redacted]d being seen today for ongoing prenatal care.  She is currently monitored for the following issues for this low-risk pregnancy and has Appendicitis; Acute appendicitis; Asthma; Supervision of normal pregnancy; Obesity affecting pregnancy; and Antenatal screening encounter on their problem list.  ?----------------------------------------------------------------------------------- ?Patient reports no complaints.  Feeling good.  A little tired. Considering breastfeeding this time,  good conversation on pumping/what to do once back to work-this was her barrier last time.  ?-Had dating/anatomy US today, further than expected-ok with EDD.  Excited to be pregnant.  ? ?Contractions: Not present.  .  Movement: Present. Leaking Fluid denies.  ?----------------------------------------------------------------------------------- ?The following portions of the patient's history were reviewed and updated as appropriate: allergies, current medications, past family history, past medical history, past social history, past surgical history and problem list. Problem list updated. ? ?Objective  ?Blood pressure 126/80, weight 248 lb (112.5 kg), last menstrual period 02/18/2021. ?Pregravid weight 260 lb (117.9 kg) Total Weight Gain -12 lb (-5.443 kg) ?Urinalysis: Urine Protein    Urine Glucose   ? ?Fetal Status: Fetal Heart Rate (bpm): 145   Movement: Present    ? ?General:  Alert, oriented and cooperative. Patient is in no acute distress.  ?Skin: Skin is warm and dry. No rash noted.   ?Cardiovascular: Normal heart rate noted  ?Respiratory: Normal respiratory effort, no problems with respiration noted  ?Abdomen: Soft, gravid, appropriate for gestational age. Pain/Pressure: Absent     ?Pelvic:  Cervical exam deferred        ?Extremities: Normal range of motion.     ?Mental Status: Normal mood and affect. Normal behavior. Normal judgment and thought  content.  ? ?Assessment  ? ?26 y.o. G2P1001 at [redacted]w[redacted]d by  10/30/2021, by Ultrasound presenting for routine prenatal visit ? ?Plan  ? ?pregnancy Problems (from 05/20/21 to present)   ? ? Problem Noted Resolved  ? Supervision of normal pregnancy 05/20/2021 by Tresea Mall, CNM No  ? Overview Signed 05/20/2021 12:50 PM by Tresea Mall, CNM  ?   ?Nursing Staff Provider  ?Office Location  [facility] Dating    ?Language  English Anatomy US    ?Flu Vaccine   Genetic Screen  NIPS:   ?TDaP vaccine    Hgb A1C or  ?GTT Early : ?Third trimester :   ?Covid    LAB RESULTS   ?Rhogam  A Pos Blood Type     ?Feeding Plan ? Antibody    ?Contraception  Rubella    ?Circumcision  RPR Non Reactive (04/12 0924)   ?Pediatrician   HBsAg Negative (04/12 0924)   ?Support Person Onalee Hua HIV Non Reactive (04/12 5361)  ?Prenatal Classes  Varicella   ?  GBS  (For PCN allergy, check sensitivities)   ?BTL Consent     ?VBAC Consent NA Pap  05/20/21  ?  Hgb Electro    ?Pelvis Tested 7#6oz CF   ?   SMA   ?     ? ? ?  ?  ? Obesity affecting pregnancy 05/20/2021 by Tresea Mall, CNM No  ? Antenatal screening encounter 05/20/2021 by Tresea Mall, CNM No  ? Screen for sexually transmitted diseases 05/20/2021 by Tresea Mall, CNM 05/20/2021 by Tresea Mall, CNM  ? Screening for iron deficiency anemia 05/20/2021 by Tresea Mall, CNM 05/20/2021 by Tresea Mall, CNM  ? Screening for diabetes mellitus 05/20/2021 by Tresea Mall, CNM 05/20/2021 by Tresea Mall, CNM  ? ?  ?  ?  general obstetric precautions including but not limited to vaginal bleeding, contractions, leaking of fluid and fetal movement were reviewed in detail with the patient. ?Please refer to After Visit Summary for other counseling recommendations.  ? ?Return in about 4 weeks (around 07/02/2021) for return next week for ealry 1 hour, then in 4 weeks for ROB. ? ?Prenatal labs collected  ? ?Fu anatomy scan ordered  ? ?Carie Caddy, CNM  ?Domingo Pulse, [facility]  ?06/04/21   ?4:17 PM  ? ? ?

## 2021-06-04 NOTE — Progress Notes (Signed)
U/s today, ended up doing anatomy u/s.  ?

## 2021-06-05 LAB — HEMOGLOBIN A1C
Est. average glucose Bld gHb Est-mCnc: 94 mg/dL
Hgb A1c MFr Bld: 4.9 % (ref 4.8–5.6)

## 2021-06-05 LAB — CBC/D/PLT+RPR+RH+ABO+RUBIGG...
Antibody Screen: NEGATIVE
Basophils Absolute: 0 10*3/uL (ref 0.0–0.2)
Basos: 0 %
EOS (ABSOLUTE): 0.1 10*3/uL (ref 0.0–0.4)
Eos: 1 %
HCV Ab: NONREACTIVE
HIV Screen 4th Generation wRfx: NONREACTIVE
Hematocrit: 34.8 % (ref 34.0–46.6)
Hemoglobin: 11.7 g/dL (ref 11.1–15.9)
Hepatitis B Surface Ag: NEGATIVE
Immature Grans (Abs): 0.1 10*3/uL (ref 0.0–0.1)
Immature Granulocytes: 1 %
Lymphocytes Absolute: 1.2 10*3/uL (ref 0.7–3.1)
Lymphs: 12 %
MCH: 30.4 pg (ref 26.6–33.0)
MCHC: 33.6 g/dL (ref 31.5–35.7)
MCV: 90 fL (ref 79–97)
Monocytes Absolute: 0.5 10*3/uL (ref 0.1–0.9)
Monocytes: 4 %
Neutrophils Absolute: 8.3 10*3/uL — ABNORMAL HIGH (ref 1.4–7.0)
Neutrophils: 82 %
Platelets: 294 10*3/uL (ref 150–450)
RBC: 3.85 x10E6/uL (ref 3.77–5.28)
RDW: 12.4 % (ref 11.7–15.4)
RPR Ser Ql: NONREACTIVE
Rh Factor: POSITIVE
Rubella Antibodies, IGG: 2.56 index (ref >0.99)
Varicella zoster IgG: 492 index (ref >165)
WBC: 10.2 10*3/uL (ref 3.4–10.8)

## 2021-06-05 LAB — HCV INTERPRETATION

## 2021-06-11 ENCOUNTER — Other Ambulatory Visit: Payer: Self-pay

## 2021-06-11 ENCOUNTER — Other Ambulatory Visit: Payer: Medicaid Other

## 2021-06-11 DIAGNOSIS — Z131 Encounter for screening for diabetes mellitus: Secondary | ICD-10-CM

## 2021-06-11 DIAGNOSIS — Z3480 Encounter for supervision of other normal pregnancy, unspecified trimester: Secondary | ICD-10-CM

## 2021-06-11 LAB — MATERNIT 21 PLUS CORE, BLOOD
Fetal Fraction: 9
Result (T21): NEGATIVE
Trisomy 13 (Patau syndrome): NEGATIVE
Trisomy 18 (Edwards syndrome): NEGATIVE
Trisomy 21 (Down syndrome): NEGATIVE

## 2021-06-12 LAB — GLUCOSE TOLERANCE, 1 HOUR: Glucose, 1Hr PP: 77 mg/dL (ref 70–199)

## 2021-06-16 ENCOUNTER — Ambulatory Visit: Admission: RE | Admit: 2021-06-16 | Payer: Medicaid Other | Source: Ambulatory Visit

## 2021-06-17 ENCOUNTER — Encounter: Payer: Medicaid Other | Admitting: Family Medicine

## 2021-06-17 ENCOUNTER — Other Ambulatory Visit: Payer: Self-pay | Admitting: Advanced Practice Midwife

## 2021-06-19 ENCOUNTER — Ambulatory Visit (HOSPITAL_COMMUNITY): Payer: Medicaid Other

## 2021-07-02 ENCOUNTER — Telehealth: Payer: Self-pay

## 2021-07-02 NOTE — Telephone Encounter (Signed)
Pt returned call.  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  Pt appreciative and aware vm is full. ?

## 2021-07-02 NOTE — Telephone Encounter (Signed)
Mailbox full

## 2021-07-02 NOTE — Telephone Encounter (Signed)
Mailbox is full.

## 2021-07-02 NOTE — Telephone Encounter (Signed)
Patient is calling needing to speak with someone about xray rules while she is pregnant. I advise patient to leave message on nurse line that someone will get back to her. Patient then called back needing answer. I wasn't able to find a nurse to advise. I explained that requirements that was listed on the [facility] Pregnancy dental fax form IF patient is in need of Xray, please double shield and use thyroid shield. Would you please follow up with this patient?

## 2021-07-10 ENCOUNTER — Ambulatory Visit: Admission: RE | Admit: 2021-07-10 | Payer: Medicaid Other | Source: Ambulatory Visit

## 2021-07-13 ENCOUNTER — Encounter: Payer: Medicaid Other | Admitting: Advanced Practice Midwife

## 2021-07-13 ENCOUNTER — Encounter: Payer: Medicaid Other | Admitting: Obstetrics

## 2021-07-13 ENCOUNTER — Ambulatory Visit
Admission: RE | Admit: 2021-07-13 | Discharge: 2021-07-13 | Disposition: A | Discharge status: Home / Self Care | Payer: Medicaid Other | Source: Ambulatory Visit | Attending: Licensed Practical Nurse | Admitting: Licensed Practical Nurse

## 2021-07-13 DIAGNOSIS — Z3689 Encounter for other specified antenatal screening: Secondary | ICD-10-CM | POA: Diagnosis not present

## 2021-07-13 DIAGNOSIS — Z3A18 18 weeks gestation of pregnancy: Secondary | ICD-10-CM | POA: Insufficient documentation

## 2021-07-13 DIAGNOSIS — Z3A24 24 weeks gestation of pregnancy: Secondary | ICD-10-CM | POA: Diagnosis not present

## 2021-07-13 DIAGNOSIS — O321XX0 Maternal care for breech presentation, not applicable or unspecified: Secondary | ICD-10-CM | POA: Diagnosis not present

## 2021-07-13 DIAGNOSIS — Z3480 Encounter for supervision of other normal pregnancy, unspecified trimester: Secondary | ICD-10-CM | POA: Insufficient documentation

## 2021-07-15 ENCOUNTER — Ambulatory Visit: Payer: 59 | Admitting: Obstetrics and Gynecology

## 2021-07-17 ENCOUNTER — Encounter: Payer: Medicaid Other | Admitting: Licensed Practical Nurse

## 2021-07-27 ENCOUNTER — Ambulatory Visit (INDEPENDENT_AMBULATORY_CARE_PROVIDER_SITE_OTHER): Payer: Medicaid Other | Admitting: Obstetrics

## 2021-07-27 VITALS — BP 126/70 | Wt 255.0 lb

## 2021-07-27 DIAGNOSIS — Z3A26 26 weeks gestation of pregnancy: Secondary | ICD-10-CM

## 2021-07-27 DIAGNOSIS — Z3480 Encounter for supervision of other normal pregnancy, unspecified trimester: Secondary | ICD-10-CM

## 2021-07-27 NOTE — Progress Notes (Signed)
Left arm has been going numb. Novb. No lof. Need to go over anatomy u/s, missed appt ?

## 2021-07-27 NOTE — Progress Notes (Signed)
Routine Prenatal Care Visit ? ?Subjective  ?Amanda Buck is a 26 y.o. G2P1001 at [redacted]w[redacted]d being seen today for ongoing prenatal care.  She is currently monitored for the following issues for this high-risk pregnancy and has Appendicitis; Acute appendicitis; Asthma; Supervision of normal pregnancy; Obesity affecting pregnancy; and Antenatal screening encounter on their problem list.  ?----------------------------------------------------------------------------------- ?Patient reports some difficulty sleeping. Is sleeping on her back- Has hip pain.Marland Kitchen   ?Contractions: Not present. Vag. Bleeding: None.   . Leaking Fluid denies.  ?----------------------------------------------------------------------------------- ?The following portions of the patient's history were reviewed and updated as appropriate: allergies, current medications, past family history, past medical history, past social history, past surgical history and problem list. Problem list updated. ? ?Objective  ?Blood pressure 126/70, weight 255 lb (115.7 kg), last menstrual period 02/18/2021. ?Pregravid weight 260 lb (117.9 kg) Total Weight Gain -5 lb (-2.268 kg) ?Urinalysis: Urine Protein    Urine Glucose   ? ?Fetal Status:          ? ?General:  Alert, oriented and cooperative. Patient is in no acute distress.  ?Skin: Skin is warm and dry. No rash noted.   ?Cardiovascular: Normal heart rate noted  ?Respiratory: Normal respiratory effort, no problems with respiration noted  ?Abdomen: Soft, gravid, appropriate for gestational age. Pain/Pressure: Absent     ?Pelvic:  Cervical exam deferred        ?Extremities: Normal range of motion.     ?Mental Status: Normal mood and affect. Normal behavior. Normal judgment and thought content.  ? ?Assessment  ? ?26 y.o. G2P1001 at [redacted]w[redacted]d by  10/30/2021, by Ultrasound presenting for routine prenatal visit ? ?Plan  ? ?pregnancy Problems (from 05/20/21 to present)   ? Problem Noted Resolved  ? Supervision of normal pregnancy  05/20/2021 by Rod Can, CNM No  ? Overview Addendum 06/04/2021  4:19 PM by Allen Derry, CNM  ?   ?Nursing Staff Provider  ?Office Location  [facility] Dating  [redacted]w[redacted]d on  3/2  ?Language  English Anatomy US  Needs fu   ?Flu Vaccine   Genetic Screen  NIPS:   ?TDaP vaccine    Hgb A1C or  ?GTT Early : ?Third trimester :   ?Covid    LAB RESULTS   ?Rhogam  A Pos Blood Type     ?Feeding Plan ? Antibody    ?Contraception  Rubella    ?Circumcision  RPR Non Reactive (04/12 0924)   ?Pediatrician   HBsAg Negative (04/12 0924)   ?Support Person Shanon Brow HIV Non Reactive (04/12 [id number])  ?Prenatal Classes  Varicella   ?  GBS  (For PCN allergy, check sensitivities)   ?BTL Consent     ?VBAC Consent NA Pap  05/20/21  ?  Hgb Electro    ?Pelvis Tested 7#6oz CF   ?   SMA   ?     ? ? ?  ?  ? Obesity affecting pregnancy 05/20/2021 by Rod Can, CNM No  ? Antenatal screening encounter 05/20/2021 by Rod Can, [place] No  ? Screen for sexually transmitted diseases 05/20/2021 by Rod Can, CNM 05/20/2021 by Rod Can, [place]  ? Screening for iron deficiency anemia 05/20/2021 by Rod Can, CNM 05/20/2021 by Rod Can, CNM  ? Screening for diabetes mellitus 05/20/2021 by Rod Can, CNM 05/20/2021 by Rod Can, CNM  ?  ?  ? ?Preterm labor symptoms and general obstetric precautions including but not limited to vaginal bleeding, contractions, leaking of fluid and fetal movement were reviewed in detail with  the patient. ?Please refer to After Visit Summary for other counseling recommendations.  ?Discussed other sleeping positions, and the use of Tylenol and Unisom for Sleep. ?Measures large for dates- growth scan ordered. ? ?Return in about 2 weeks (around 08/10/2021) for return OB, 28 week labs. ? ?Imagene Riches, CNM  ?07/27/2021 9:44 AM   ? ?

## 2021-08-10 ENCOUNTER — Ambulatory Visit
Admission: RE | Admit: 2021-08-10 | Discharge: 2021-08-10 | Disposition: A | Discharge status: Home / Self Care | Payer: Medicaid Other | Source: Ambulatory Visit | Attending: Obstetrics | Admitting: Obstetrics

## 2021-08-10 ENCOUNTER — Other Ambulatory Visit (INDEPENDENT_AMBULATORY_CARE_PROVIDER_SITE_OTHER): Payer: Medicaid Other

## 2021-08-10 ENCOUNTER — Ambulatory Visit (INDEPENDENT_AMBULATORY_CARE_PROVIDER_SITE_OTHER): Payer: Medicaid Other | Admitting: Family Medicine

## 2021-08-10 VITALS — BP 120/60 | Wt 256.0 lb

## 2021-08-10 DIAGNOSIS — O99013 Anemia complicating pregnancy, third trimester: Secondary | ICD-10-CM

## 2021-08-10 DIAGNOSIS — O321XX0 Maternal care for breech presentation, not applicable or unspecified: Secondary | ICD-10-CM | POA: Diagnosis not present

## 2021-08-10 DIAGNOSIS — Z3689 Encounter for other specified antenatal screening: Secondary | ICD-10-CM | POA: Insufficient documentation

## 2021-08-10 DIAGNOSIS — Z3483 Encounter for supervision of other normal pregnancy, third trimester: Secondary | ICD-10-CM | POA: Diagnosis not present

## 2021-08-10 DIAGNOSIS — Z3A28 28 weeks gestation of pregnancy: Secondary | ICD-10-CM

## 2021-08-10 DIAGNOSIS — M25551 Pain in right hip: Secondary | ICD-10-CM

## 2021-08-10 DIAGNOSIS — Z3480 Encounter for supervision of other normal pregnancy, unspecified trimester: Secondary | ICD-10-CM | POA: Insufficient documentation

## 2021-08-10 DIAGNOSIS — O99211 Obesity complicating pregnancy, first trimester: Secondary | ICD-10-CM

## 2021-08-10 DIAGNOSIS — Z3A30 30 weeks gestation of pregnancy: Secondary | ICD-10-CM | POA: Insufficient documentation

## 2021-08-10 LAB — POCT URINALYSIS DIPSTICK OB
Glucose, UA: NEGATIVE
POC,PROTEIN,UA: NEGATIVE

## 2021-08-10 NOTE — Progress Notes (Signed)
? ? ?  PRENATAL VISIT NOTE ? ?Subjective:  ?Amanda Buck is a 26 y.o. G2P1001 at [redacted]w[redacted]d being seen today for ongoing prenatal care.  She is currently monitored for the following issues for this low-risk pregnancy and has Asthma; Supervision of high risk pregnancy, antepartum; Obesity affecting pregnancy; Antenatal screening encounter; and Anemia affecting pregnancy in third trimester on their problem list. ? ?Patient reports no complaints.  Contractions: Not present. Vag. Bleeding: None.  Movement: Present. Denies leaking of fluid.  ? ?The following portions of the patient's history were reviewed and updated as appropriate: allergies, current medications, past family history, past medical history, past social history, past surgical history and problem list.  ? ?Objective:  ? ?Vitals:  ? 08/10/21 0923  ?BP: 120/60  ?Weight: 256 lb (116.1 kg)  ? ? ?Fetal Status: Fetal Heart Rate (bpm): 145 Fundal Height: 31 cm Movement: Present    ? ?General:  Alert, oriented and cooperative. Patient is in no acute distress.  ?Skin: Skin is warm and dry. No rash noted.   ?Cardiovascular: Normal heart rate noted  ?Respiratory: Normal respiratory effort, no problems with respiration noted  ?Abdomen: Soft, gravid, appropriate for gestational age.  Pain/Pressure: Present     ?Pelvic: Cervical exam deferred        ?Extremities: Normal range of motion.  Edema: None  ?Mental Status: Normal mood and affect. Normal behavior. Normal judgment and thought content.  ? ?Assessment and Plan:  ?Pregnancy: G2P1001 at [redacted]w[redacted]d ?1. Encounter for supervision of other normal pregnancy in third trimester ?Up to date ?Needs Tdap next visit ?- 28 Week RH+Panel ? ?2. Obesity affecting pregnancy in first trimester ?TWG=-4 lb (-1.814 kg)  ? ?3. Right hip pain ?Has possible congenital hip dysplasia-- wore a frog leg brace in youth ?Having back and right hip pain ?Discussed PT referral ?- Ambulatory referral to Physical Therapy ? ? ?Preterm labor symptoms and  general obstetric precautions including but not limited to vaginal bleeding, contractions, leaking of fluid and fetal movement were reviewed in detail with the patient. ?Please refer to After Visit Summary for other counseling recommendations.  ? ?Return in about 2 weeks (around 08/24/2021) for Routine prenatal care. ? ?Future Appointments  ?Date Time Provider Department Center  ?08/24/2021 10:15 AM Tresea Mall, CNM [facility] None  ?09/07/2021 10:15 AM Dominic, Courtney Heys, CNM [facility] None  ?09/21/2021 10:15 AM Mirna Mires, CNM [facility] None  ? ? ?Federico Flake, MD ? ?

## 2021-08-11 LAB — 28 WEEK RH+PANEL
Basophils Absolute: 0 10*3/uL (ref 0.0–0.2)
Basos: 0 %
EOS (ABSOLUTE): 0.1 10*3/uL (ref 0.0–0.4)
Eos: 2 %
Gestational Diabetes Screen: 100 mg/dL (ref 70–139)
HIV Screen 4th Generation wRfx: NONREACTIVE
Hematocrit: 31.5 % — ABNORMAL LOW (ref 34.0–46.6)
Hemoglobin: 10.5 g/dL — ABNORMAL LOW (ref 11.1–15.9)
Immature Grans (Abs): 0.2 10*3/uL — ABNORMAL HIGH (ref 0.0–0.1)
Immature Granulocytes: 3 %
Lymphocytes Absolute: 0.9 10*3/uL (ref 0.7–3.1)
Lymphs: 12 %
MCH: 30.3 pg (ref 26.6–33.0)
MCHC: 33.3 g/dL (ref 31.5–35.7)
MCV: 91 fL (ref 79–97)
Monocytes Absolute: 0.4 10*3/uL (ref 0.1–0.9)
Monocytes: 5 %
Neutrophils Absolute: 6.2 10*3/uL (ref 1.4–7.0)
Neutrophils: 78 %
Platelets: 284 10*3/uL (ref 150–450)
RBC: 3.47 x10E6/uL — ABNORMAL LOW (ref 3.77–5.28)
RDW: 12.3 % (ref 11.7–15.4)
RPR Ser Ql: NONREACTIVE
WBC: 7.8 10*3/uL (ref 3.4–10.8)

## 2021-08-14 ENCOUNTER — Encounter: Payer: Self-pay | Admitting: Family Medicine

## 2021-08-14 DIAGNOSIS — O99013 Anemia complicating pregnancy, third trimester: Secondary | ICD-10-CM | POA: Insufficient documentation

## 2021-08-14 MED ORDER — FERROUS SULFATE 325 (65 FE) MG PO TABS: 325.0000 mg | ORAL_TABLET | ORAL | 1 refills | Status: DC

## 2021-08-24 ENCOUNTER — Ambulatory Visit (INDEPENDENT_AMBULATORY_CARE_PROVIDER_SITE_OTHER): Payer: Medicaid Other | Admitting: Advanced Practice Midwife

## 2021-08-24 ENCOUNTER — Encounter: Payer: Self-pay | Admitting: Physical Therapy

## 2021-08-24 ENCOUNTER — Ambulatory Visit: Payer: Medicaid Other | Attending: Family Medicine | Admitting: Physical Therapy

## 2021-08-24 VITALS — BP 120/80 | Wt 254.0 lb

## 2021-08-24 DIAGNOSIS — M6281 Muscle weakness (generalized): Secondary | ICD-10-CM | POA: Diagnosis not present

## 2021-08-24 DIAGNOSIS — M25551 Pain in right hip: Secondary | ICD-10-CM | POA: Diagnosis not present

## 2021-08-24 DIAGNOSIS — O99213 Obesity complicating pregnancy, third trimester: Secondary | ICD-10-CM

## 2021-08-24 DIAGNOSIS — R262 Difficulty in walking, not elsewhere classified: Secondary | ICD-10-CM | POA: Diagnosis not present

## 2021-08-24 DIAGNOSIS — O0990 Supervision of high risk pregnancy, unspecified, unspecified trimester: Secondary | ICD-10-CM

## 2021-08-24 DIAGNOSIS — Z3A30 30 weeks gestation of pregnancy: Secondary | ICD-10-CM

## 2021-08-24 DIAGNOSIS — Z369 Encounter for antenatal screening, unspecified: Secondary | ICD-10-CM

## 2021-08-24 DIAGNOSIS — O99013 Anemia complicating pregnancy, third trimester: Secondary | ICD-10-CM

## 2021-08-24 NOTE — Progress Notes (Signed)
Routine Prenatal Care Visit  Subjective  Amanda Buck is a 26 y.o. G2P1001 at [redacted]w[redacted]d being seen today for ongoing prenatal care.  She is currently monitored for the following issues for this high-risk pregnancy and has Asthma; Supervision of high risk pregnancy, antepartum; Obesity affecting pregnancy; Antenatal screening encounter; and Anemia affecting pregnancy in third trimester on their problem list.  ----------------------------------------------------------------------------------- Patient reports no complaints.   Contractions: Not present. Vag. Bleeding: None.  Movement: Present. Leaking Fluid denies.  ----------------------------------------------------------------------------------- The following portions of the patient's history were reviewed and updated as appropriate: allergies, current medications, past family history, past medical history, past social history, past surgical history and problem list. Problem list updated.  Objective  Blood pressure 120/80, weight 254 lb (115.2 kg), last menstrual period 02/18/2021. Pregravid weight 260 lb (117.9 kg) Total Weight Gain -6 lb (-2.722 kg) Urinalysis: Urine Protein    Urine Glucose    Fetal Status: Fetal Heart Rate (bpm): 134 Fundal Height: 33 cm Movement: Present     General:  Alert, oriented and cooperative. Patient is in no acute distress.  Skin: Skin is warm and dry. No rash noted.   Cardiovascular: Normal heart rate noted  Respiratory: Normal respiratory effort, no problems with respiration noted  Abdomen: Soft, gravid, appropriate for gestational age. Pain/Pressure: Present     Pelvic:  Cervical exam deferred        Extremities: Normal range of motion.  Edema: None  Mental Status: Normal mood and affect. Normal behavior. Normal judgment and thought content.   Assessment   26 y.o. G2P1001 at [redacted]w[redacted]d by  10/30/2021, by Ultrasound presenting for routine prenatal visit  Plan   pregnancy Problems (from 05/20/21 to present)     Problem Noted Resolved   Supervision of high risk pregnancy, antepartum 05/20/2021 by Rod Can, CNM No   Overview Addendum 06/04/2021  4:19 PM by Allen Derry, [place] Staff Provider  Office Location  [facility] Dating  [redacted]w[redacted]d on  3/2  Language  English Anatomy US  Needs fu   Flu Vaccine   Genetic Screen  NIPS:   TDaP vaccine    Hgb A1C or  GTT Early : Third trimester :   Covid    LAB RESULTS   Rhogam  A Pos Blood Type     Feeding Plan ? Antibody    Contraception  Rubella    Circumcision  RPR Non Reactive (04/12 0924)   Pediatrician   HBsAg Negative (04/12 0924)   Support Person Shanon Brow HIV Non Reactive (04/12 [id number])  Prenatal Classes  Varicella     GBS  (For PCN allergy, check sensitivities)   BTL Consent     VBAC Consent NA Pap  05/20/21    Hgb Electro    Pelvis Tested 7#6oz CF      SMA               Obesity affecting pregnancy 05/20/2021 by Rod Can, CNM No   Antenatal screening encounter 05/20/2021 by Rod Can, CNM No   Screen for sexually transmitted diseases 05/20/2021 by Rod Can, CNM 05/20/2021 by Rod Can, CNM   Screening for iron deficiency anemia 05/20/2021 by Rod Can, CNM 05/20/2021 by Rod Can, CNM   Screening for diabetes mellitus 05/20/2021 by Rod Can, [place] 05/20/2021 by Rod Can, CNM       Preterm labor symptoms and general obstetric precautions including but not limited to vaginal bleeding, contractions, leaking of fluid and fetal movement were reviewed  in detail with the patient. Please refer to After Visit Summary for other counseling recommendations.   Return in about 2 weeks (around 09/07/2021) for growth scan in office and rob after.  Rod Can, CNM 08/24/2021 10:27 AM

## 2021-08-24 NOTE — Therapy (Signed)
OUTPATIENT PHYSICAL THERAPY FEMALE PELVIC EVALUATION   Patient Name: Amanda Buck MRN: [id number] DOB:January 12, 1996, 26 y.o., female Today's Date: 08/24/2021   PT End of Session - 08/24/21 1447     Visit Number 1    Number of Visits 8    Date for PT Re-Evaluation 10/19/21    Authorization Type [employer]    PT Start Time 1455    PT Stop Time 1535    PT Time Calculation (min) 40 min    Activity Tolerance Patient tolerated treatment well;Patient limited by pain    Behavior During Therapy [facility] for tasks assessed/performed             Past Medical History:  Diagnosis Date   Anemia affecting pregnancy    Appendicitis 12/27/2016   Asthma    No current treatment needed   Headache(784.0)    Obesity (BMI 35.0-39.9 without comorbidity) 05/31/2018   BMI 38.28 kg/m2   Obesity affecting pregnancy in third trimester, antepartum    Past Surgical History:  Procedure Laterality Date   LAPAROSCOPIC APPENDECTOMY N/A 12/28/2016   Procedure: APPENDECTOMY LAPAROSCOPIC;  Surgeon: Amanda Bue, MD;  Location: [facility];  Service: General;  Laterality: N/A;   Patient Active Problem List   Diagnosis Date Noted   Anemia affecting pregnancy in third trimester 08/14/2021   Supervision of high risk pregnancy, antepartum 05/20/2021   Obesity affecting pregnancy 05/20/2021   Antenatal screening encounter 05/20/2021   Asthma 05/31/2018    PCP: None  REFERRING PROVIDER: Federico Flake, MD  REFERRING DIAG: [phone] (ICD-10-CM) - Right hip pain   THERAPY DIAG:  Pain in right hip  Difficulty in walking, not elsewhere classified  Muscle weakness (generalized)  Rationale for Evaluation and Treatment Rehabilitation  PRECAUTIONS: Other: pregnant ; EDD 10/30/2021  WEIGHT BEARING RESTRICTIONS No  FALLS:  Has patient fallen in last 6 months? Yes (tripped and fell on coccyx March 2023)  ONSET DATE: third trimester   SUBJECTIVE:                                                                                                                                                                                            CHIEF COMPLAINT: Patient reports that during childhood she had to wear a brace at night to stabilize the hip joint. Patient notes pain has increased during pregnancy and with sudden weight gain. Patient notes she limps with increased standing and walking activity (60 min tolerance before discontinuing/changing position). Patient can sit for no more than 20 minutes. Patient is unable to sleep on R side because of pain. Patient does not recall intense pain with R hip  during last pregnancy. Patient does note R sided weakness.    PERTINENT HISTORY/CHART REVIEW:  Red flags (bowel/bladder changes, saddle paresthesia, personal history of cancer, h/o spinal tumors, h/o compression fx, h/o abdominal aneurysm, abdominal pain, chills/fever, night sweats, nausea, vomiting, unrelenting pain, first onset of insidious LBP <21 y/o): Negative MD and CNM notes: Patient reports some difficulty sleeping. Is sleeping on her back- Has hip pain. Has possible congenital hip dysplasia-- wore a frog leg brace in youth. Having back and right hip pain  PAIN:  Are you having pain? 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 Reino Buck, [place] [employer]) Nocturia: 1-3x Amanda Buck Incomplete emptying: No  Pain with urination: Negative Stream: Strong and Weak Urgency: Yes  Difficulty initiating urination: Negative Intermittent stream: Negative Frequent UTI: Negative.   GASTROINTESTINAL HISTORY: Type of bowel movement: (Bristol Stool Scale) 4 Frequency of BMs: 3x/week (baseline 1x/day) Incomplete bowel movement: No  Pain with defecation: Negative Straining with defecation: Negative Hemorrhoids: Negative ; internal/external; active/latent Fiber supplement: No  Incontinence: Negative.    SEXUAL HISTORY AND FUNCTION: Pain with penetration: After Intercourse Pain with external stimulation: No  Sexual abuse: No    OBJECTIVE:  DIAGNOSTIC TESTING/IMAGING: No recent imaging of hips or lumbar spine  COGNITION: Patient is oriented to person, place, and time.  Recent memory is intact.  Remote memory is intact.  Attention span and concentration are intact.  Expressive speech is intact.  Patient's fund of knowledge is within normal limits for educational level.   POSTURE/OBSERVATIONS:  Lumbar lordosis: significantly increased with anteriorly tilted pelvis Thoracic kyphosis: WNL Iliac crest height: L IC elevated Lumbar lateral shift: not formally assessed  Pelvic obliquity: R ASIS anterior/ L PSIS posterior Leg length discrepancy: not formally assessed   GAIT: Distance walked: 70 ft Assistive device utilized: None Level of assistance: Complete Independence Trendelenburg R: Positive L: Positive Patient ambulates with B ER of hips and wide BOS commensurate with third trimester.  RANGE OF MOTION: deferred 2/2 to time constraints  AROM (Normal range in degrees) AROM  08/24/2021  Lumbar   Flexion (65) WNL  Extension (30) WNL  Right lateral flexion (25) WNL  Left lateral flexion (25) WNL  Right  rotation (30) WNL  Left rotation (30) WNL      Hip LEFT RIGHT  Flexion (125)    Extension (15)    Abduction (40)    Adduction     Internal Rotation (45)    External Rotation (45)    (* = pain; blank rows = not tested)  SENSATION: deferred 2/2 to time constraints Grossly intact to light touch bilateral LEs as determined by testing dermatomes L2-S2 Proprioception and hot/cold testing deferred on this date  STRENGTH: MMT deferred 2/2 to time constraints   RLE LLE  Hip Flexion    Hip Extension    Hip Abduction  Hip Adduction     Hip ER     Hip IR     Knee Extension    Knee Flexion    Dorsiflexion     Plantarflexion (seated)    (* = pain; blank rows = not tested)  MUSCLE LENGTH: deferred 2/2 to time constraints Hamstrings:  Ely (quadriceps):  Thomas (hip flexors):  Ober:   ABDOMINAL: deferred 2/2 to time constraints Palpation: Diastasis: Scar mobility: Rib flare:  SPECIAL TESTS: deferred 2/2 to time constraints   PALPATION: deferred 2/2 to time constraints  LOCATION LEFT  RIGHT           Lumbar paraspinals    Quadratus Lumborum    Gluteus Maximus    Gluteus Medius    Deep hip external rotators    PSIS    Fortin's Area (SIJ)    Greater Trochanter    ASIS    Sacral border    Coccyx    Ischial tuberosity    (blank rows = not tested) Graded on 0-4 scale (0 = no pain, 1 = pain, 2 = pain with wincing/grimacing/flinching, 3 = pain with withdrawal, 4 = unwilling to allow palpation)  PHYSICAL PERFORMANCE MEASURES: STS: WNL Deep Squat: RLE STS: LLE STS:  : 5TSTS:    EXTERNAL PELVIC EXAM: deferred 2/2 to time constraints Breath coordination: Voluntary Contraction: present/absent Relaxation: full/delayed/non-relaxing Perineal movement with sustained IAP increase ("bear down"): descent/no change/elevation/excessive descent Perineal movement with rapid IAP increase ("cough"): elevation/no change/descent Palpation of bulbocavernosus: Palpation of  ischiocavernosus: Palpation of pubic symphysis: Palpation of superficial transverse perineal:  INTERNAL VAGINAL EXAM: not indicated Introitus Appears:  Skin integrity:  Scar mobility: Strength (PERF):  Symmetry: Palpation: Prolapse: (0 no contraction, 1 flicker, 2 weak squeeze and no lift, 3 fair squeeze and definite lift, 4 good squeeze and lift against resistance, 5 strong squeeze against strong resistance)   RECTAL EXAM: not indicated Anal wink: present/absent Symmetry: Palpation: Strength (PERF):  (0 no contraction, 1 flicker, 2 weak squeeze and no lift, 3 fair squeeze and definite lift, 4 good squeeze and lift against resistance, 5 strong squeeze against strong resistance)  PATIENT EDUCATION:  Patient educated on prognosis, POC, and provided with HEP including: [id number]. Patient articulated understanding and returned demonstration. Patient will benefit from further education in order to maximize compliance and understanding for long-term therapeutic gains.   PATIENT SURVEYS:  FOTO Hip 55  ASSESSMENT:  Clinical Impression: Patient is a 26 year old presenting to clinic with chief complaints of R hip pain with increased activity and prolonged positioning. Today's evaluation is suggestive of deficits in R hip motor control, posture, gait, and pain as evidenced by 10/10 worst pain at R hip, wide BOS with B ER and B Trendelenburg sign, anteriorly tilted pelvis with L IC elevation and R anterior pelvic obliquity. Patient's responses on FOTO hip outcome measures (55) indicate significant functional limitations/disability/distress. Patient's progress may be limited due to progressive nature of pregnancy and longstanding history of R hip joint deficits; however, patient's motivation is advantageous. Patient was able to achieve modest relief with seated hip stretches during today's evaluation and responded positively to active and educational interventions. Patient will benefit from  continued skilled therapeutic intervention to address deficits in R hip motor control, posture, gait, and pain in order to increase function and improve overall QOL.   Objective impairments: Abnormal gait, decreased coordination, decreased endurance, decreased strength, improper body mechanics, postural dysfunction, and pain.   Activity limitations: cleaning, laundry, driving, shopping, community activity, and  occupation.   Personal factors: Behavior pattern, Fitness, Past/current experiences, Profession, and 3+ comorbidities: asthma, headches, obesity, anemia affecting pregnancy,   are also affecting patient's functional outcome.   Rehab Potential: Fair    Clinical decision making: Evolving/moderate complexity  Evaluation complexity: Moderate   GOALS: Goals reviewed with patient? Yes  LONG TERM GOALS: Target date: 10/19/2021  Patient will demonstrate improved function as evidenced by a score of 68 on FOTO measure for full participation in activities at home and in the community.  Baseline: 55 Goal status: INITIAL  Patient will demonstrate independence with HEP in order to maximize therapeutic gains and improve carryover from physical therapy sessions to ADLs in the home and community. Baseline: [id number] Goal status: INITIAL  Patient will decrease worst pain as reported on NPRS by at least 2 points to demonstrate clinically significant reduction in pain in order to restore/improve function and overall QOL. Baseline: 10/10 Goal status: INITIAL   PLAN: Rehab frequency: 1x/week  Rehab duration: 8 weeks  Planned interventions: Therapeutic exercises, Therapeutic activity, Neuromuscular re-education, Balance training, Gait training, Patient/Family education, Joint mobilization, Orthotic/Fit training, Spinal mobilization, Cryotherapy, Moist heat, Compression bandaging, scar mobilization, Taping, and Manual therapy     Kathryne ErikssonKatlin E Giamarie Bueche, PT 08/24/2021, 2:55 PM

## 2021-08-25 ENCOUNTER — Telehealth: Payer: Self-pay

## 2021-08-25 NOTE — Telephone Encounter (Signed)
Pt calling; is 30wks; what to take for a cough; starting to get sick.  [phone] [facility]

## 2021-08-26 ENCOUNTER — Other Ambulatory Visit: Payer: Self-pay

## 2021-08-26 ENCOUNTER — Emergency Department
Admission: EM | Admit: 2021-08-26 | Discharge: 2021-08-26 | Disposition: A | Discharge status: Home / Self Care | Payer: Medicaid Other | Attending: Emergency Medicine | Admitting: Emergency Medicine

## 2021-08-26 DIAGNOSIS — O26893 Other specified pregnancy related conditions, third trimester: Secondary | ICD-10-CM | POA: Insufficient documentation

## 2021-08-26 DIAGNOSIS — O98513 Other viral diseases complicating pregnancy, third trimester: Secondary | ICD-10-CM | POA: Insufficient documentation

## 2021-08-26 DIAGNOSIS — J101 Influenza due to other identified influenza virus with other respiratory manifestations: Secondary | ICD-10-CM | POA: Diagnosis not present

## 2021-08-26 DIAGNOSIS — J029 Acute pharyngitis, unspecified: Secondary | ICD-10-CM

## 2021-08-26 DIAGNOSIS — Z3A30 30 weeks gestation of pregnancy: Secondary | ICD-10-CM | POA: Diagnosis not present

## 2021-08-26 DIAGNOSIS — Z20822 Contact with and (suspected) exposure to covid-19: Secondary | ICD-10-CM | POA: Insufficient documentation

## 2021-08-26 DIAGNOSIS — R42 Dizziness and giddiness: Secondary | ICD-10-CM

## 2021-08-26 DIAGNOSIS — R0981 Nasal congestion: Secondary | ICD-10-CM

## 2021-08-26 DIAGNOSIS — R000 Tachycardia, unspecified: Secondary | ICD-10-CM | POA: Diagnosis not present

## 2021-08-26 DIAGNOSIS — O99513 Diseases of the respiratory system complicating pregnancy, third trimester: Secondary | ICD-10-CM | POA: Diagnosis not present

## 2021-08-26 LAB — CBC
HCT: 33.2 % — ABNORMAL LOW (ref 36.0–46.0)
Hemoglobin: 10.5 g/dL — ABNORMAL LOW (ref 12.0–15.0)
MCH: 29.4 pg (ref 26.0–34.0)
MCHC: 31.6 g/dL (ref 30.0–36.0)
MCV: 93 fL (ref 80.0–100.0)
Platelets: 201 10*3/uL (ref 150–400)
RBC: 3.57 MIL/uL — ABNORMAL LOW (ref 3.87–5.11)
RDW: 13.6 % (ref 11.5–15.5)
WBC: 7.6 10*3/uL (ref 4.0–10.5)
nRBC: 0 % (ref 0.0–0.2)

## 2021-08-26 LAB — BASIC METABOLIC PANEL
Anion gap: 8 (ref 5–15)
BUN: <5 mg/dL — ABNORMAL LOW (ref 6–20)
CO2: 22 mmol/L (ref 22–32)
Calcium: 8.6 mg/dL — ABNORMAL LOW (ref 8.9–10.3)
Chloride: 105 mmol/L (ref 98–111)
Creatinine, Ser: 0.69 mg/dL (ref 0.44–1.00)
GFR, Estimated: >60 mL/min (ref >60)
Glucose, Bld: 97 mg/dL (ref 70–99)
Potassium: 3.4 mmol/L — ABNORMAL LOW (ref 3.5–5.1)
Sodium: 135 mmol/L (ref 135–145)

## 2021-08-26 LAB — URINALYSIS, ROUTINE W REFLEX MICROSCOPIC
Bilirubin Urine: NEGATIVE
Glucose, UA: NEGATIVE mg/dL
Hgb urine dipstick: NEGATIVE
Ketones, ur: NEGATIVE mg/dL
Nitrite: NEGATIVE
Protein, ur: NEGATIVE mg/dL
Specific Gravity, Urine: 1.01 (ref 1.005–1.030)
pH: 6 (ref 5.0–8.0)

## 2021-08-26 LAB — RESP PANEL BY RT-PCR (FLU A&B, COVID) ARPGX2
Influenza A by PCR: NEGATIVE
Influenza B by PCR: POSITIVE — AB
SARS Coronavirus 2 by RT PCR: NEGATIVE

## 2021-08-26 LAB — POC URINE PREG, ED: Preg Test, Ur: POSITIVE — AB

## 2021-08-26 MED ORDER — CHLORASEPTIC MOUTH PAIN 1.4 % MT LIQD: 1.0000 | OROMUCOSAL | 0 refills | Status: DC | PRN

## 2021-08-26 MED ORDER — ONDANSETRON 8 MG PO TBDP
8.0000 mg | ORAL_TABLET | Freq: Three times a day (TID) | ORAL | 0 refills | Status: DC | PRN
Start: 2021-08-26 — End: 2021-10-19

## 2021-08-26 NOTE — Telephone Encounter (Signed)
Pt states she is on her way to urgent care; is feeling worse today; sxs started Monday; has cough, sore throat, body aches, runny nose, dizziness, it's hard to breath.  Pt stated she left work early today b/c of dizziness and feeling worse. Adv pt she can take e.s. tylenol two po q6h while awake; robitussin plain/DM or Musinex plain/DM, Hall's cough drops; warm salt water gargles.  Adv she definitely needed to be seen d/t struggling to breath. (Per 2023 protocol)

## 2021-08-26 NOTE — ED Notes (Signed)
Pt c/o generalized body aches, sore throat, and nasal congestion. Pt states that at work today she was having hot flashes, feeling dizzy and lightheaded. Pt states that now she is very cold. Pt denies fevers.

## 2021-08-26 NOTE — ED Notes (Signed)
Signature pad not working at this time. Pts discharge instructions, prescriptions, and follow up care reviewed with pt. Pt denies any questions at this time. Pt  A & O and ambulatory without difficulty or distress at time of discharge.

## 2021-08-26 NOTE — ED Triage Notes (Signed)
Pt is [redacted] weeks pregnant c/o dizziness, sore throat, congestion and SOB for the past week. Denies any pain

## 2021-08-26 NOTE — ED Provider Notes (Signed)
[facility] Provider Note   Event Date/Time   First MD Initiated Contact with Patient 08/26/21 1504     (approximate) History  Dizziness  HPI Amanda Buck is a 26 y.o. female with a stated past medical history of 30-week pregnancy who presents complaining of dizziness, sore throat, congestion, and shortness of breath over the past 5 days.  Patient states that she has multiple family numbers at home with similar symptoms.  Patient denies any recent travel or food out of the ordinary.  Patient also endorses headache.  Endorses continued fetal movement.  Patient currently denies any vision changes, tinnitus, difficulty speaking, facial droop, chest pain, abdominal pain, nausea/vomiting/diarrhea, dysuria, or weakness/numbness/paresthesias in any extremity Physical Exam  Triage Vital Signs: ED Triage Vitals  Enc Vitals Group     BP 08/26/21 1212 121/71     Pulse Rate 08/26/21 1212 (!) 115     Resp 08/26/21 1212 20     Temp 08/26/21 1212 98.9 F (37.2 C)     Temp Source 08/26/21 1212 Oral     SpO2 08/26/21 1212 99 %     Weight 08/26/21 1154 254 lb (115.2 kg)     Height 08/26/21 1154 5\' 4"  (1.626 m)     Head Circumference --      Peak Flow --      Pain Score 08/26/21 1154 3     Pain Loc --      Pain Edu? --      Excl. in GC? --    Most recent vital signs: Vitals:   08/26/21 1212 08/26/21 1639  BP: 121/71 115/73  Pulse: (!) 115 99  Resp: 20 17  Temp: 98.9 F (37.2 C)   SpO2: 99% 100%   General: Awake, oriented x4. CV:  Good peripheral perfusion.  Resp:  Normal effort.  Clear to auscultation bilaterally Abd:  Gravid.  Nontender to palpation Other:  Young adult pregnant Caucasian female laying in bed in no acute distress.  Conjunctival injection appreciated bilaterally.  Posterior oropharyngeal erythema without purulence appreciated. ED Results / Procedures / Treatments  Labs (all labs ordered are listed, but only abnormal results are displayed) Labs  Reviewed  RESP PANEL BY RT-PCR (FLU A&B, COVID) ARPGX2 - Abnormal; Notable for the following components:      Result Value   Influenza B by PCR POSITIVE (*)    All other components within normal limits  BASIC METABOLIC PANEL - Abnormal; Notable for the following components:   Potassium 3.4 (*)    BUN <5 (*)    Calcium 8.6 (*)    All other components within normal limits  CBC - Abnormal; Notable for the following components:   RBC 3.57 (*)    Hemoglobin 10.5 (*)    HCT 33.2 (*)    All other components within normal limits  URINALYSIS, ROUTINE W REFLEX MICROSCOPIC - Abnormal; Notable for the following components:   Color, Urine YELLOW (*)    APPearance HAZY (*)    Leukocytes,Ua SMALL (*)    Bacteria, UA RARE (*)    All other components within normal limits  POC URINE PREG, ED - Abnormal; Notable for the following components:   Preg Test, Ur POSITIVE (*)    All other components within normal limits  CBG MONITORING, ED   EKG ED ECG REPORT I, 08/28/21, the attending physician, personally viewed and interpreted this ECG. Date: 08/26/2021 EKG Time: 1219 Rate: 111 Rhythm: Tachycardic sinus rhythm QRS Axis:  normal Intervals: normal ST/T Wave abnormalities: normal Narrative Interpretation: Tachycardic sinus rhythm.  No evidence of acute ischemia  PROCEDURES: Critical Care performed: No .1-3 Lead EKG Interpretation Performed by: Merwyn Katos, MD Authorized by: Merwyn Katos, MD     Interpretation: normal     ECG rate:  98   ECG rate assessment: normal     Rhythm: sinus rhythm     Ectopy: none     Conduction: normal   MEDICATIONS ORDERED IN ED: Medications - No data to display IMPRESSION / MDM / ASSESSMENT AND PLAN / ED COURSE  I reviewed the triage vital signs and the nursing notes.                              Otherwise healthy patient presenting with constellation of symptoms likely representing uncomplicated viral upper respiratory symptoms as characterized  by mild pharyngitis, congestion, generalized body aches, and dizziness.  PCR positive for influenza B  Unlikely PTA/RPA: no hot potato voice, no uvular deviation, Unlikely Esophageal rupture: No history of dysphagia Unlikely deep space infection/Ludwigs Low suspicion for CNS infection bacterial sinusitis, or pneumonia given exam and history.  Unlikely Strep or EBV as centor negative and with no pharyngeal exudate, posterior LAD, or splenomegaly.  Will attempt to alleviate symptoms conservatively; no overt indications at this time for antibiotics. No respiratory distress, otherwise relatively well appearing and nontoxic. Will discuss prompt follow up with PMD and strict return precautions.    FINAL CLINICAL IMPRESSION(S) / ED DIAGNOSES   Final diagnoses:  Lightheadedness  Sore throat  Influenza B  Sinus congestion   Rx / DC Orders   ED Discharge Orders          Ordered    ondansetron (ZOFRAN-ODT) 8 MG disintegrating tablet  Every 8 hours PRN        08/26/21 1615    phenol (CHLORASEPTIC MOUTH PAIN) 1.4 % LIQD  As needed        08/26/21 1615           Note:  This document was prepared using Dragon voice recognition software and may include unintentional dictation errors.   Merwyn Katos, MD 08/26/21 1723

## 2021-09-07 ENCOUNTER — Encounter: Payer: Medicaid Other | Admitting: Licensed Practical Nurse

## 2021-09-07 ENCOUNTER — Ambulatory Visit: Payer: Medicaid Other | Admitting: Physical Therapy

## 2021-09-10 ENCOUNTER — Ambulatory Visit (INDEPENDENT_AMBULATORY_CARE_PROVIDER_SITE_OTHER): Payer: Medicaid Other

## 2021-09-10 DIAGNOSIS — O0990 Supervision of high risk pregnancy, unspecified, unspecified trimester: Secondary | ICD-10-CM | POA: Diagnosis not present

## 2021-09-10 DIAGNOSIS — O99213 Obesity complicating pregnancy, third trimester: Secondary | ICD-10-CM | POA: Diagnosis not present

## 2021-09-10 DIAGNOSIS — Z369 Encounter for antenatal screening, unspecified: Secondary | ICD-10-CM

## 2021-09-11 ENCOUNTER — Encounter: Payer: Self-pay | Admitting: Licensed Practical Nurse

## 2021-09-11 ENCOUNTER — Ambulatory Visit (INDEPENDENT_AMBULATORY_CARE_PROVIDER_SITE_OTHER): Payer: Medicaid Other | Admitting: Licensed Practical Nurse

## 2021-09-11 VITALS — BP 90/60

## 2021-09-11 DIAGNOSIS — O0990 Supervision of high risk pregnancy, unspecified, unspecified trimester: Secondary | ICD-10-CM

## 2021-09-11 DIAGNOSIS — Z23 Encounter for immunization: Secondary | ICD-10-CM | POA: Diagnosis not present

## 2021-09-11 DIAGNOSIS — Z3A33 33 weeks gestation of pregnancy: Secondary | ICD-10-CM

## 2021-09-11 DIAGNOSIS — Z6841 Body Mass Index (BMI) 40.0 and over, adult: Secondary | ICD-10-CM

## 2021-09-11 LAB — POCT URINALYSIS DIPSTICK OB
Glucose, UA: NEGATIVE
POC,PROTEIN,UA: NEGATIVE

## 2021-09-11 NOTE — Progress Notes (Signed)
Routine Prenatal Care Visit  Subjective  Amanda Buck is a 26 y.o. G2P1001 at [redacted]w[redacted]d being seen today for ongoing prenatal care.  She is currently monitored for the following issues for this high-risk pregnancy and has Asthma; Supervision of high risk pregnancy, antepartum; Obesity affecting pregnancy; Antenatal screening encounter; and Anemia affecting pregnancy in third trimester on their problem list.  ----------------------------------------------------------------------------------- Patient reports heartburn.  Had an episode of vomiting after eating a salad prior to apt. Otherwise has been fine.  Desires Mirena, not ready to have sterilization.  Had recent growth [place] : Impression: 1. [redacted]w[redacted]d Viable Singleton Intrauterine pregnancy previously established criteria. 2. Growth is 83 %ile.  AFI is 19.4 cm.   -has not started Fe supplement, when she went to the pharmacy there was a warning that stated, "Ask a doctor prior to starting" Reassured she should start supplement.   Contractions: Not present. Vag. Bleeding: None.  Movement: Present. Leaking Fluid denies.  ----------------------------------------------------------------------------------- The following portions of the patient's history were reviewed and updated as appropriate: allergies, current medications, past family history, past medical history, past social history, past surgical history and problem list. Problem list updated.  Objective  Blood pressure 90/60, last menstrual period 02/18/2021. Pregravid weight 260 lb (117.9 kg) Total Weight Gain -6 lb (-2.722 kg) Urinalysis: Urine Protein    Urine Glucose    Fetal Status: Fetal Heart Rate (bpm): 120 Fundal Height: 38 cm Movement: Present     General:  Alert, oriented and cooperative. Patient is in no acute distress.  Skin: Skin is warm and dry. No rash noted.   Cardiovascular: Normal heart rate noted  Respiratory: Normal respiratory effort, no problems with respiration noted   Abdomen: Soft, gravid, appropriate for gestational age. Pain/Pressure: Present     Pelvic:  Cervical exam deferred        Extremities: Normal range of motion.  Edema: None  Mental Status: Normal mood and affect. Normal behavior. Normal judgment and thought content.   Assessment   26 y.o. G2P1001 at [redacted]w[redacted]d by  10/30/2021, by Ultrasound presenting for routine prenatal visit  Plan   pregnancy Problems (from 05/20/21 to present)     Problem Noted Resolved   Supervision of high risk pregnancy, antepartum 05/20/2021 by Rod Can, CNM No   Overview Addendum 09/11/2021  2:46 PM by Allen Derry, [place] Staff Provider  Office Location  [facility] Dating  [redacted]w[redacted]d on  3/2  Language  English Anatomy US  Needs fu   Flu Vaccine   Genetic Screen  NIPS:   TDaP vaccine   09/11/21 Hgb A1C or  GTT Early :4.9Third trimester : 100  Covid    LAB RESULTS   Rhogam  A Pos Blood Type A/Positive/-- (03/02 1544)   Feeding Plan ? Antibody Negative (03/02 1544)  Contraception Mirena Rubella 2.56 (03/02 1544)  Circumcision  RPR Non Reactive (05/08 0957)   Pediatrician   HBsAg Negative (03/02 1544)   Support Person Shanon Brow HIV Non Reactive (05/08 0957)  Prenatal Classes multip  Varicella     GBS  (For PCN allergy, check sensitivities)   BTL Consent     VBAC Consent NA Pap  05/20/21    Hgb Electro    Pelvis Tested 7#6oz CF      SMA               Obesity affecting pregnancy 05/20/2021 by Rod Can, CNM No   Antenatal screening encounter 05/20/2021 by Rod Can, CNM No  Screen for sexually transmitted diseases 05/20/2021 by Rod Can, CNM 05/20/2021 by Rod Can, CNM   Screening for iron deficiency anemia 05/20/2021 by Rod Can, CNM 05/20/2021 by Rod Can, CNM   Screening for diabetes mellitus 05/20/2021 by Rod Can, [place] 05/20/2021 by Rod Can, CNM        Preterm labor symptoms and general obstetric precautions including but not limited to vaginal  bleeding, contractions, leaking of fluid and fetal movement were reviewed in detail with the patient. Please refer to After Visit Summary for other counseling recommendations.   Return in about 2 weeks (around 09/25/2021) for ROB.  TDAP today  Comfort measures for heartburn reviewed  Roberto Scales, [place], [facility]  09/11/21  2:50 PM

## 2021-09-14 ENCOUNTER — Telehealth: Payer: Self-pay

## 2021-09-14 ENCOUNTER — Encounter: Payer: Medicaid Other | Admitting: Physical Therapy

## 2021-09-14 NOTE — Telephone Encounter (Signed)
Pt left msg on triage saying her feet were pretty swollen, it started Saturday. Called her back to get more info. Said she is currently on her way back from the beach (3ish hours away from home). Denies headaches, blurred vision, abdominal pain, swelling in hands and face. Advised it could be from being sitted in her car for long periods of time. She said she did stop a couple of times. She elevated her feet yesterday and swelling went down. I told her to call our office to schedule a nurse visit tomorrow if the swelling continues or any other sx develop. To also continue elevating her legs and make sure she is drinking enough water.

## 2021-09-16 ENCOUNTER — Telehealth: Payer: Self-pay

## 2021-09-16 NOTE — Telephone Encounter (Signed)
Pt added to nurse schedule for BP check tomorrow at 4 she has been experience swelling in legs with tingling

## 2021-09-17 ENCOUNTER — Ambulatory Visit: Payer: Medicaid Other

## 2021-09-17 ENCOUNTER — Telehealth: Payer: Self-pay

## 2021-09-17 NOTE — Telephone Encounter (Signed)
Pt called to give [place] her BP 108/56, I called back to let her know its fine, she had an appointment for a bp check today at 4 will cancel that and see her on her next apt 8/18

## 2021-09-21 ENCOUNTER — Encounter: Payer: Medicaid Other | Admitting: Obstetrics

## 2021-09-21 ENCOUNTER — Ambulatory Visit (INDEPENDENT_AMBULATORY_CARE_PROVIDER_SITE_OTHER): Payer: Medicaid Other | Admitting: Family Medicine

## 2021-09-21 ENCOUNTER — Ambulatory Visit: Payer: Medicaid Other | Attending: Family Medicine | Admitting: Physical Therapy

## 2021-09-21 ENCOUNTER — Encounter: Payer: Self-pay | Admitting: Physical Therapy

## 2021-09-21 VITALS — BP 120/62 | Wt 256.0 lb

## 2021-09-21 DIAGNOSIS — O0990 Supervision of high risk pregnancy, unspecified, unspecified trimester: Secondary | ICD-10-CM | POA: Diagnosis not present

## 2021-09-21 DIAGNOSIS — R262 Difficulty in walking, not elsewhere classified: Secondary | ICD-10-CM | POA: Diagnosis not present

## 2021-09-21 DIAGNOSIS — O99213 Obesity complicating pregnancy, third trimester: Secondary | ICD-10-CM

## 2021-09-21 DIAGNOSIS — M6281 Muscle weakness (generalized): Secondary | ICD-10-CM | POA: Diagnosis not present

## 2021-09-21 DIAGNOSIS — Z3A34 34 weeks gestation of pregnancy: Secondary | ICD-10-CM

## 2021-09-21 DIAGNOSIS — M25551 Pain in right hip: Secondary | ICD-10-CM | POA: Diagnosis not present

## 2021-09-21 DIAGNOSIS — O368130 Decreased fetal movements, third trimester, not applicable or unspecified: Secondary | ICD-10-CM

## 2021-09-21 DIAGNOSIS — O99013 Anemia complicating pregnancy, third trimester: Secondary | ICD-10-CM

## 2021-09-21 LAB — POCT URINALYSIS DIPSTICK OB
Glucose, UA: NEGATIVE
POC,PROTEIN,UA: NEGATIVE

## 2021-09-21 NOTE — Therapy (Signed)
OUTPATIENT PHYSICAL THERAPY FEMALE PELVIC TREATMENT   Patient Name: Amanda Buck MRN: [id number] DOB:24-Aug-1995, 26 y.o., female Today's Date: 09/21/2021   PT End of Session - 09/21/21 1343     Visit Number 2    Number of Visits 8    Date for PT Re-Evaluation 10/19/21    Authorization Type [employer]    PT Start Time 1340    PT Stop Time 1425    PT Time Calculation (min) 45 min    Activity Tolerance Patient tolerated treatment well    Behavior During Therapy [facility] for tasks assessed/performed             Past Medical History:  Diagnosis Date   Anemia affecting pregnancy    Appendicitis 12/27/2016   Asthma    No current treatment needed   Headache(784.0)    Obesity (BMI 35.0-39.9 without comorbidity) 05/31/2018   BMI 38.28 kg/m2   Obesity affecting pregnancy in third trimester, antepartum    Past Surgical History:  Procedure Laterality Date   LAPAROSCOPIC APPENDECTOMY N/A 12/28/2016   Procedure: APPENDECTOMY LAPAROSCOPIC;  Surgeon: Berna Bue, MD;  Location: [facility];  Service: General;  Laterality: N/A;   Patient Active Problem List   Diagnosis Date Noted   Anemia affecting pregnancy in third trimester 08/14/2021   Supervision of high risk pregnancy, antepartum 05/20/2021   Obesity affecting pregnancy 05/20/2021   Antenatal screening encounter 05/20/2021   Asthma 05/31/2018    PCP: None  REFERRING PROVIDER: Federico Flake, MD  REFERRING DIAG: [phone] (ICD-10-CM) - Right hip pain   THERAPY DIAG:  Pain in right hip  Difficulty in walking, not elsewhere classified  Muscle weakness (generalized)  Rationale for Evaluation and Treatment Rehabilitation  PRECAUTIONS: Other: pregnant ; EDD 10/30/2021  WEIGHT BEARING RESTRICTIONS No  FALLS:  Has patient fallen in last 6 months? Yes (tripped and fell on coccyx March 2023)  ONSET DATE: third trimester   SUBJECTIVE: Patient notes that with use of stretches R hip pain has been much improved.  Patient does note increased SUI with coughing/sneezing; reports increased frequency and volume of SUI. Patient also having increased breathing difficulty at night and when in standing postures.    PERTINENT HISTORY/CHART REVIEW:  Red flags (bowel/bladder changes, saddle paresthesia, personal history of cancer, h/o spinal tumors, h/o compression fx, h/o abdominal aneurysm, abdominal pain, chills/fever, night sweats, nausea, vomiting, unrelenting pain, first onset of insidious LBP <21 y/o): Negative MD and CNM notes: Patient reports some difficulty sleeping. Is sleeping on her back- Has hip pain. Has possible congenital hip dysplasia-- wore a frog leg brace in youth. Having back and right hip pain  PAIN:  Are you having pain? Yes NPRS scale:  0/10 (current) 0/10 (least) 10/10 (worst) Pain location: R hip (lateral aspect) Pain type: intermittent Pain descriptors: aching, sharp, deep Pain duration: > 2 hours  Aggravating factors: walking, prolonged positioning Relieving factors: deep hip flexion; massage   OCCUPATION/LEISURE ACTIVITIES:  Sales executive, [place], [place], camping  PLOF: Independent  PATIENT GOALS: "Anything that can help me"   TREATMENT  Pre-treatment assessment: EXTERNAL PELVIC EXAM: Patient educated on the purpose of the procedure/exam and articulated understanding and consented to the procedure/exam.  Breath coordination: present (inconsistent and minimal) Voluntary Contraction: present Relaxation: delayed Perineal movement with sustained IAP increase ("bear down"): descent Perineal movement with rapid IAP increase ("cough"): descent Palpation of bulbocavernosus: not formally assessed  Palpation of ischiocavernosus: not formally assessed  Palpation of pubic symphysis: not formally assessed  Palpation  of superficial transverse perineal: not formally assessed   Manual Therapy:   Neuromuscular Re-education: Sidelying diaphragmatic breathing with  VCs and TCs for downregulation of the nervous system and improved management of IAP Sidelying, PFM lengthening with inhalation. VCs and TCs to decrease compensatory patterns and encourage optimal relaxation of the PFM. Patient education on birthing mechanics for reduced risk of tear and POP, body positions to prepare for birth, and body mechanics for labor and delivery to allow for optimal pelvic widening at both the superior and inferior portions dependent on stage of labor and delivery.  Patient education on postural impacts on work of breathing, educated on potential benefits of raising head of bed 2-6", lifting rib cage to allow for greater bolus of oxygen with a single breath.    Therapeutic Exercise:   Treatments unbilled:  Post-treatment assessment:  Patient educated throughout session on appropriate technique and form using multi-modal cueing, HEP, and activity modification. Patient articulated understanding and returned demonstration.  Patient Response to interventions:         PATIENT EDUCATION:  Patient educated on prognosis, POC, and provided with HEP including: [id number]. Patient articulated understanding and returned demonstration. Patient will benefit from further education in order to maximize compliance and understanding for long-term therapeutic gains.   PATIENT SURVEYS:  FOTO Hip 55  ASSESSMENT:  Clinical Impression: Patient presents to clinic with excellent motivation to participate in therapy. Patient demonstrates deficits in R hip motor control, posture, gait, and pain. Patient able to achieve PFM lengthening coordinated with breath during today's session and responded positively to educational interventions. Patient will benefit from continued skilled therapeutic intervention to address remaining deficits in R hip motor control, posture, gait, and pain in order to increase function and improve overall QOL.    Objective impairments: Abnormal gait, decreased  coordination, decreased endurance, decreased strength, improper body mechanics, postural dysfunction, and pain.   Activity limitations: cleaning, laundry, driving, shopping, community activity, and occupation.   Personal factors: Behavior pattern, Fitness, Past/current experiences, Profession, and 3+ comorbidities: asthma, headches, obesity, anemia affecting pregnancy,   are also affecting patient's functional outcome.   Rehab Potential: Fair    Clinical decision making: Evolving/moderate complexity  Evaluation complexity: Moderate   GOALS: Goals reviewed with patient? Yes  LONG TERM GOALS: Target date: 10/19/2021  Patient will demonstrate improved function as evidenced by a score of 68 on FOTO measure for full participation in activities at home and in the community.  Baseline: 55 Goal status: INITIAL  Patient will demonstrate independence with HEP in order to maximize therapeutic gains and improve carryover from physical therapy sessions to ADLs in the home and community. Baseline: [id number] Goal status: INITIAL  Patient will decrease worst pain as reported on NPRS by at least 2 points to demonstrate clinically significant reduction in pain in order to restore/improve function and overall QOL. Baseline: 10/10 Goal status: INITIAL   PLAN: Rehab frequency: 1x/week  Rehab duration: 8 weeks  Planned interventions: Therapeutic exercises, Therapeutic activity, Neuromuscular re-education, Balance training, Gait training, Patient/Family education, Joint mobilization, Orthotic/Fit training, Spinal mobilization, Cryotherapy, Moist heat, Compression bandaging, scar mobilization, Taping, and Manual therapy     Sheria Lang PT, DPT [phone]  09/21/2021, 1:43 PM

## 2021-09-21 NOTE — Progress Notes (Signed)
   PRENATAL VISIT NOTE  Subjective:  Amanda Buck is a 26 y.o. G2P1001 at [redacted]w[redacted]d being seen today for ongoing prenatal care.  She is currently monitored for the following issues for this high-risk pregnancy and has Asthma; Supervision of high risk pregnancy, antepartum; Obesity affecting pregnancy; Antenatal screening encounter; and Anemia affecting pregnancy in third trimester on their problem list.  Patient reports  baby not moving as much .  Contractions: Not present. Vag. Bleeding: None.  Movement: Present. Denies leaking of fluid.   The following portions of the patient's history were reviewed and updated as appropriate: allergies, current medications, past family history, past medical history, past social history, past surgical history and problem list.   Objective:   Vitals:   09/21/21 1051  BP: 120/62  Weight: 256 lb (116.1 kg)    Fetal Status:     Movement: Present     General:  Alert, oriented and cooperative. Patient is in no acute distress.  Skin: Skin is warm and dry. No rash noted.   Cardiovascular: Normal heart rate noted  Respiratory: Normal respiratory effort, no problems with respiration noted  Abdomen: Soft, gravid, appropriate for gestational age.  Pain/Pressure: Present     Pelvic: Cervical exam deferred        Extremities: Normal range of motion.     Mental Status: Normal mood and affect. Normal behavior. Normal judgment and thought content.   Assessment and Plan:  Pregnancy: G2P1001 at [redacted]w[redacted]d 1. Supervision of high risk pregnancy, antepartum Up to dat Reviewed contraceptive plan and feeding, box updated NST today was reactive and reassuring Needs growth [place] at MFM but this has not been scheduled.  Breech by leopolds and FHR was near fundus. Needs [place] anyways but this will be helpful to confirm VTX Has PT for her hip, going well.  - POC Urinalysis Dipstick OB  2. [redacted] weeks gestation of pregnancy - POC Urinalysis Dipstick OB  3. Anemia affecting pregnancy  in third trimester Recheck HGB in July  4. Obesity affecting pregnancy in third trimester TWG=-4 lb (-1.814 kg) which is below goal  5. Decreased fetal movement affecting management of pregnancy in third trimester, single or unspecified fetus Reactive NST today   Preterm labor symptoms and general obstetric precautions including but not limited to vaginal bleeding, contractions, leaking of fluid and fetal movement were reviewed in detail with the patient. Please refer to After Visit Summary for other counseling recommendations.   Return in about 2 weeks (around 10/05/2021).  Future Appointments  Date Time Provider Department Center  09/21/2021  1:45 PM Kathryne Eriksson, PT [facility] None  09/28/2021  1:45 PM Kathryne Eriksson, PT [facility] None  10/05/2021  3:35 PM Tresea Mall, CNM [facility] None    Federico Flake, MD

## 2021-09-22 ENCOUNTER — Telehealth: Payer: Self-pay | Admitting: Family Medicine

## 2021-09-22 NOTE — Telephone Encounter (Signed)
I contacted patient via phone to let her know we are able to add her for ultrasound this Thursday, 09/24/21 at 1 pm due to opening in the scheduled per Sheralyn Boatman it is ok to offer 4 pm if that works better for patient.

## 2021-09-22 NOTE — Telephone Encounter (Signed)
Patient is aware 

## 2021-09-24 ENCOUNTER — Ambulatory Visit (INDEPENDENT_AMBULATORY_CARE_PROVIDER_SITE_OTHER): Payer: Medicaid Other

## 2021-09-24 DIAGNOSIS — Z6841 Body Mass Index (BMI) 40.0 and over, adult: Secondary | ICD-10-CM | POA: Diagnosis not present

## 2021-09-24 DIAGNOSIS — O0990 Supervision of high risk pregnancy, unspecified, unspecified trimester: Secondary | ICD-10-CM | POA: Diagnosis not present

## 2021-09-24 NOTE — Addendum Note (Signed)
Addended by: Kathlene Cote on: 09/24/2021 09:22 AM   Modules accepted: Orders

## 2021-09-28 ENCOUNTER — Encounter: Payer: Self-pay | Admitting: Physical Therapy

## 2021-09-28 ENCOUNTER — Ambulatory Visit: Payer: Medicaid Other | Admitting: Physical Therapy

## 2021-09-28 DIAGNOSIS — M6281 Muscle weakness (generalized): Secondary | ICD-10-CM | POA: Diagnosis not present

## 2021-09-28 DIAGNOSIS — R262 Difficulty in walking, not elsewhere classified: Secondary | ICD-10-CM | POA: Diagnosis not present

## 2021-09-28 DIAGNOSIS — M25551 Pain in right hip: Secondary | ICD-10-CM | POA: Diagnosis not present

## 2021-09-28 NOTE — Therapy (Signed)
OUTPATIENT PHYSICAL THERAPY FEMALE PELVIC TREATMENT   Patient Name: Amanda Buck MRN: [id number] DOB:07/28/1995, 26 y.o., female Today's Date: 09/28/2021   PT End of Session - 09/28/21 1338     Visit Number 3    Number of Visits 8    Date for PT Re-Evaluation 10/19/21    Authorization Type [employer]    PT Start Time 1340    PT Stop Time 1420    PT Time Calculation (min) 40 min    Activity Tolerance Patient tolerated treatment well    Behavior During Therapy [facility] for tasks assessed/performed             Past Medical History:  Diagnosis Date   Anemia affecting pregnancy    Appendicitis 12/27/2016   Asthma    No current treatment needed   Headache(784.0)    Obesity (BMI 35.0-39.9 without comorbidity) 05/31/2018   BMI 38.28 kg/m2   Obesity affecting pregnancy in third trimester, antepartum    Past Surgical History:  Procedure Laterality Date   LAPAROSCOPIC APPENDECTOMY N/A 12/28/2016   Procedure: APPENDECTOMY LAPAROSCOPIC;  Surgeon: Berna Bue, MD;  Location: [facility];  Service: General;  Laterality: N/A;   Patient Active Problem List   Diagnosis Date Noted   Anemia affecting pregnancy in third trimester 08/14/2021   Supervision of high risk pregnancy, antepartum 05/20/2021   Obesity affecting pregnancy 05/20/2021   Antenatal screening encounter 05/20/2021   Asthma 05/31/2018    PCP: None  REFERRING PROVIDER: Federico Flake, MD  REFERRING DIAG: [phone] (ICD-10-CM) - Right hip pain   THERAPY DIAG:  Difficulty in walking, not elsewhere classified  Pain in right hip  Muscle weakness (generalized)  Rationale for Evaluation and Treatment Rehabilitation  PRECAUTIONS: Other: pregnant ; EDD 10/30/2021  WEIGHT BEARING RESTRICTIONS No  FALLS:  Has patient fallen in last 6 months? Yes (tripped and fell on coccyx March 2023)  ONSET DATE: third trimester   SUBJECTIVE: Patient states that her hip is doing much better. Her biggest complaint is  pressure in the pelvis at this time and difficulty moving about as she progresses in pregnancy.   PERTINENT HISTORY/CHART REVIEW:  Red flags (bowel/bladder changes, saddle paresthesia, personal history of cancer, h/o spinal tumors, h/o compression fx, h/o abdominal aneurysm, abdominal pain, chills/fever, night sweats, nausea, vomiting, unrelenting pain, first onset of insidious LBP <21 y/o): Negative MD and CNM notes: Patient reports some difficulty sleeping. Is sleeping on her back- Has hip pain. Has possible congenital hip dysplasia-- wore a frog leg brace in youth. Having back and right hip pain  PAIN:  Are you having pain? Yes NPRS scale:  0/10 (current) 0/10 (least) 10/10 (worst) Pain location: R hip (lateral aspect) Pain type: intermittent Pain descriptors: aching, sharp, deep Pain duration: > 2 hours  Aggravating factors: walking, prolonged positioning Relieving factors: deep hip flexion; massage   OCCUPATION/LEISURE ACTIVITIES:  Sales executive, [place], [place], camping  PLOF: Independent  PATIENT GOALS: "Anything that can help me"   TREATMENT  Manual Therapy:   Neuromuscular Re-education: Modified quadruped TrA with coordinated breath for improved pelvic pressure Modified quadruped posterior pelvic tilts for improved pelvic pressure Modified quadruped rocking for improved pelvic pressure Tall kneeling hip hinge with posterior pelvic tilt throughout for improved pelvic pressure Standing postural strengthening with RTB:  Chest expansion  Low rows  Serve a tray  Hug a tree Patient education on safe return to activity including diaphragmatic breathing, "HUT", and gentle abdominals.  Therapeutic Exercise:   Treatments unbilled:  Post-treatment assessment:  Patient educated throughout session on appropriate technique and form using multi-modal cueing, HEP, and activity modification. Patient articulated understanding and returned  demonstration.  Patient Response to interventions: Comfortable to f/u as needed or after delivery       PATIENT EDUCATION:  Patient educated on prognosis, POC, and provided with HEP including: [id number]. Patient articulated understanding and returned demonstration. Patient will benefit from further education in order to maximize compliance and understanding for long-term therapeutic gains.   PATIENT SURVEYS:  FOTO Hip 55  ASSESSMENT:  Clinical Impression: Patient presents to clinic with excellent motivation to participate in therapy. Patient demonstrates deficits in R hip motor control, posture, gait, and pain. Patient with excellent coordination of TrA mm in quadruped and standing postures during today's session and responded positively to educational and active interventions. Patient will benefit from continued skilled therapeutic intervention to address remaining deficits in R hip motor control, posture, gait, and pain in order to increase function and improve overall QOL.    Objective impairments: Abnormal gait, decreased coordination, decreased endurance, decreased strength, improper body mechanics, postural dysfunction, and pain.   Activity limitations: cleaning, laundry, driving, shopping, community activity, and occupation.   Personal factors: Behavior pattern, Fitness, Past/current experiences, Profession, and 3+ comorbidities: asthma, headches, obesity, anemia affecting pregnancy,   are also affecting patient's functional outcome.   Rehab Potential: Fair    Clinical decision making: Evolving/moderate complexity  Evaluation complexity: Moderate   GOALS: Goals reviewed with patient? Yes  LONG TERM GOALS: Target date: 10/19/2021  Patient will demonstrate improved function as evidenced by a score of 68 on FOTO measure for full participation in activities at home and in the community.  Baseline: 55 Goal status: INITIAL  Patient will demonstrate independence with HEP in  order to maximize therapeutic gains and improve carryover from physical therapy sessions to ADLs in the home and community. Baseline: [id number] Goal status: INITIAL  Patient will decrease worst pain as reported on NPRS by at least 2 points to demonstrate clinically significant reduction in pain in order to restore/improve function and overall QOL. Baseline: 10/10 Goal status: INITIAL   PLAN: Rehab frequency: 1x/week  Rehab duration: 8 weeks  Planned interventions: Therapeutic exercises, Therapeutic activity, Neuromuscular re-education, Balance training, Gait training, Patient/Family education, Joint mobilization, Orthotic/Fit training, Spinal mobilization, Cryotherapy, Moist heat, Compression bandaging, scar mobilization, Taping, and Manual therapy     Sheria Lang PT, DPT [phone]  09/28/2021, 1:39 PM

## 2021-10-05 ENCOUNTER — Encounter: Payer: Medicaid Other | Admitting: Advanced Practice Midwife

## 2021-10-08 ENCOUNTER — Encounter: Payer: Medicaid Other | Admitting: Obstetrics & Gynecology

## 2021-10-09 ENCOUNTER — Encounter: Payer: Self-pay | Admitting: Family Medicine

## 2021-10-12 ENCOUNTER — Other Ambulatory Visit: Payer: Self-pay | Admitting: Licensed Practical Nurse

## 2021-10-12 DIAGNOSIS — O0990 Supervision of high risk pregnancy, unspecified, unspecified trimester: Secondary | ICD-10-CM

## 2021-10-12 DIAGNOSIS — Z6841 Body Mass Index (BMI) 40.0 and over, adult: Secondary | ICD-10-CM

## 2021-10-12 NOTE — Progress Notes (Signed)
Growth US done on 6/22 Repeat growth [place] ordered for around 7/21. Carie Caddy, CNM  Domingo Pulse, [facility]  10/12/21  11:22 AM '

## 2021-10-16 ENCOUNTER — Telehealth: Payer: Self-pay

## 2021-10-16 NOTE — Telephone Encounter (Signed)
Pt called back, BP readings are 11/64 and 116/62 5 mins apart. Denies any headaches or blurred vision. She has appt 7/17 at 835. Advised to monitor sx and head to ED if any new occur.

## 2021-10-16 NOTE — Telephone Encounter (Signed)
Left voicemail requesting patient check/report blood pressure if able. If she does not have access to check blood pressure, advised to contact office to schedule appointment for nurse visit. Swelling to this extent needs evaluation. Advised to report back with bp reading and also if she is having any headache/blurred vision or abdominal cramping/contractions.

## 2021-10-16 NOTE — Telephone Encounter (Signed)
TRIAGE VOICEMAIL: Patient reports numbness in fingers for as long as 30-40 minutes at a time. She is having bad swelling in her feet (almost triple their size) and a lot of discharge as well. [phone]

## 2021-10-19 ENCOUNTER — Ambulatory Visit (INDEPENDENT_AMBULATORY_CARE_PROVIDER_SITE_OTHER): Payer: Medicaid Other | Admitting: Obstetrics & Gynecology

## 2021-10-19 ENCOUNTER — Encounter: Payer: Medicaid Other | Admitting: Obstetrics & Gynecology

## 2021-10-19 VITALS — BP 114/84 | Wt 251.0 lb

## 2021-10-19 DIAGNOSIS — Z6841 Body Mass Index (BMI) 40.0 and over, adult: Secondary | ICD-10-CM

## 2021-10-19 DIAGNOSIS — O0990 Supervision of high risk pregnancy, unspecified, unspecified trimester: Secondary | ICD-10-CM

## 2021-10-19 DIAGNOSIS — O99013 Anemia complicating pregnancy, third trimester: Secondary | ICD-10-CM

## 2021-10-19 DIAGNOSIS — Z3A38 38 weeks gestation of pregnancy: Secondary | ICD-10-CM

## 2021-10-19 LAB — POCT URINALYSIS DIPSTICK OB
Glucose, UA: NEGATIVE
POC,PROTEIN,UA: NEGATIVE

## 2021-10-19 NOTE — Addendum Note (Signed)
Addended by: Donnetta Hail on: 10/19/2021 09:32 AM   Modules accepted: Orders

## 2021-10-19 NOTE — Progress Notes (Signed)
Subjective:    Amanda Buck is a 26 y.o. G2P1 being seen today for her obstetrical visit. She is at [redacted]w[redacted]d gestation. Patient reports bilateral carpal tunnel symptoms. Fetal movement: normal.  Review of Systems:   Review of Systems   Objective:    BP 114/84   Wt 251 lb (113.9 kg)   LMP 02/18/2021 (Within Weeks)   BMI 43.08 kg/m   Physical Exam  Exam  FHT:  140s BPM  Uterine Size: 39 cm  Presentation: cephalic     Assessment:    Pregnancy:  G2P1001    Plan:    Patient Active Problem List   Diagnosis Date Noted  . Anemia affecting pregnancy in third trimester 08/14/2021  . Supervision of high risk pregnancy, antepartum 05/20/2021  . Obesity affecting pregnancy 05/20/2021  . Antenatal screening encounter 05/20/2021  . Asthma 05/31/2018    Labor precautions reviewed. I told her about wrist braces for the CT symptoms. GBS obtained today (She missed her last visit).

## 2021-10-19 NOTE — Progress Notes (Signed)
ROB- finger tips continuously getting numb

## 2021-10-22 ENCOUNTER — Telehealth: Payer: Self-pay

## 2021-10-22 ENCOUNTER — Encounter: Payer: Self-pay | Admitting: Obstetrics and Gynecology

## 2021-10-22 ENCOUNTER — Inpatient Hospital Stay
Admission: EM | Admit: 2021-10-22 | Discharge: 2021-10-24 | DRG: 806 | Disposition: A | Discharge status: Home / Self Care | Payer: Medicaid Other | Attending: Certified Nurse Midwife | Admitting: Certified Nurse Midwife

## 2021-10-22 DIAGNOSIS — O0990 Supervision of high risk pregnancy, unspecified, unspecified trimester: Principal | ICD-10-CM

## 2021-10-22 DIAGNOSIS — D62 Acute posthemorrhagic anemia: Secondary | ICD-10-CM | POA: Diagnosis not present

## 2021-10-22 DIAGNOSIS — O9081 Anemia of the puerperium: Secondary | ICD-10-CM | POA: Diagnosis not present

## 2021-10-22 DIAGNOSIS — Z3A39 39 weeks gestation of pregnancy: Secondary | ICD-10-CM

## 2021-10-22 DIAGNOSIS — O99213 Obesity complicating pregnancy, third trimester: Secondary | ICD-10-CM

## 2021-10-22 DIAGNOSIS — Z369 Encounter for antenatal screening, unspecified: Secondary | ICD-10-CM

## 2021-10-22 DIAGNOSIS — O99214 Obesity complicating childbirth: Secondary | ICD-10-CM | POA: Diagnosis present

## 2021-10-22 MED ORDER — FENTANYL CITRATE (PF) 100 MCG/2ML IJ SOLN
INTRAMUSCULAR | Status: AC
  Administered 2021-10-22: 100 ug
  Filled 2021-10-22: qty 2

## 2021-10-22 NOTE — OB Triage Note (Signed)
Pt Amanda Buck 25 y.o. presents to labor and delivery triage reporting cramping every 2-3 mins. Pt is a G2P1001 at [redacted]w[redacted]d . Pt stated that her water broke right before RN entered the room. Pt stated that she felt a pop and a gush of fluid. Pt states positive fetal movement. External FM and TOCO applied to non-tender abdomen and assessing. Initial FHR . Vital signs obtained and within normal limits. Provider notified of pt.

## 2021-10-22 NOTE — Telephone Encounter (Signed)
Pt calling; 38wks; has had a H/A all day no vision disturbances; has taken tylenol c no help; feels very very sick.  [phone]  Pt thinks she took e.s. tylenol but only one; is currently drinking a Mtn. Dew; adv per old protocol may take two e.s. tylenol q6h while awake and is allowed 16oz of caffeine a day including chocolate.  Adv to give it an hour to kick in and if not better to go to L&D for eval via ED.

## 2021-10-23 ENCOUNTER — Encounter: Payer: Self-pay | Admitting: Certified Nurse Midwife

## 2021-10-23 ENCOUNTER — Telehealth: Payer: Self-pay | Admitting: Licensed Practical Nurse

## 2021-10-23 ENCOUNTER — Other Ambulatory Visit: Payer: Self-pay

## 2021-10-23 DIAGNOSIS — D649 Anemia, unspecified: Secondary | ICD-10-CM

## 2021-10-23 DIAGNOSIS — Z3A39 39 weeks gestation of pregnancy: Secondary | ICD-10-CM | POA: Diagnosis not present

## 2021-10-23 DIAGNOSIS — O99214 Obesity complicating childbirth: Secondary | ICD-10-CM | POA: Diagnosis not present

## 2021-10-23 DIAGNOSIS — O26893 Other specified pregnancy related conditions, third trimester: Secondary | ICD-10-CM | POA: Diagnosis not present

## 2021-10-23 DIAGNOSIS — D62 Acute posthemorrhagic anemia: Secondary | ICD-10-CM | POA: Diagnosis not present

## 2021-10-23 DIAGNOSIS — O9902 Anemia complicating childbirth: Secondary | ICD-10-CM | POA: Diagnosis not present

## 2021-10-23 DIAGNOSIS — O9081 Anemia of the puerperium: Secondary | ICD-10-CM | POA: Diagnosis not present

## 2021-10-23 LAB — CBC
HCT: 39.2 % (ref 36.0–46.0)
Hemoglobin: 12.8 g/dL (ref 12.0–15.0)
MCH: 29.5 pg (ref 26.0–34.0)
MCHC: 32.7 g/dL (ref 30.0–36.0)
MCV: 90.3 fL (ref 80.0–100.0)
Platelets: 252 10*3/uL (ref 150–400)
RBC: 4.34 MIL/uL (ref 3.87–5.11)
RDW: 14.2 % (ref 11.5–15.5)
WBC: 11.5 10*3/uL — ABNORMAL HIGH (ref 4.0–10.5)
nRBC: 0 % (ref 0.0–0.2)

## 2021-10-23 LAB — RPR: RPR Ser Ql: NONREACTIVE

## 2021-10-23 LAB — TYPE AND SCREEN
ABO/RH(D): A POS
Antibody Screen: NEGATIVE

## 2021-10-23 LAB — CULTURE, BETA STREP (GROUP B ONLY): Strep Gp B Culture: NEGATIVE

## 2021-10-23 MED ORDER — OXYTOCIN BOLUS FROM INFUSION
333.0000 mL | Freq: Once | INTRAVENOUS | Status: AC
Start: 2021-10-23 — End: 2021-10-23
  Administered 2021-10-23: 333 mL via INTRAVENOUS

## 2021-10-23 MED ORDER — TETANUS-DIPHTH-ACELL PERTUSSIS 5-2.5-18.5 LF-MCG/0.5 IM SUSY
0.5000 mL | PREFILLED_SYRINGE | Freq: Once | INTRAMUSCULAR | Status: DC
  Filled 2021-10-23: qty 0.5

## 2021-10-23 MED ORDER — LACTATED RINGERS IV SOLN: 500.0000 mL | INTRAVENOUS | Status: DC | PRN

## 2021-10-23 MED ORDER — LACTATED RINGERS IV SOLN: INTRAVENOUS | Status: DC

## 2021-10-23 MED ORDER — OXYCODONE-ACETAMINOPHEN 5-325 MG PO TABS
ORAL_TABLET | ORAL | Status: AC
  Administered 2021-10-23: 1 via ORAL
  Filled 2021-10-23: qty 1

## 2021-10-23 MED ORDER — SENNOSIDES-DOCUSATE SODIUM 8.6-50 MG PO TABS
2.0000 | ORAL_TABLET | ORAL | Status: DC
  Administered 2021-10-23 – 2021-10-24 (×2): 2 via ORAL
  Filled 2021-10-23 (×2): qty 2

## 2021-10-23 MED ORDER — DIBUCAINE (PERIANAL) 1 % EX OINT: 1.0000 | TOPICAL_OINTMENT | CUTANEOUS | Status: DC | PRN

## 2021-10-23 MED ORDER — OXYTOCIN-SODIUM CHLORIDE 30-0.9 UT/500ML-% IV SOLN
2.5000 [IU]/h | INTRAVENOUS | Status: DC
  Administered 2021-10-23: 2.5 [IU]/h via INTRAVENOUS

## 2021-10-23 MED ORDER — PRENATAL MULTIVITAMIN CH
1.0000 | ORAL_TABLET | Freq: Every day | ORAL | Status: DC
  Administered 2021-10-23: 1 via ORAL
  Filled 2021-10-23: qty 1

## 2021-10-23 MED ORDER — COCONUT OIL OIL
1.0000 | TOPICAL_OIL | Status: DC | PRN
Start: 2021-10-23 — End: 2021-10-24

## 2021-10-23 MED ORDER — SIMETHICONE 80 MG PO CHEW: 80.0000 mg | CHEWABLE_TABLET | ORAL | Status: DC | PRN

## 2021-10-23 MED ORDER — OXYCODONE-ACETAMINOPHEN 5-325 MG PO TABS
1.0000 | ORAL_TABLET | Freq: Four times a day (QID) | ORAL | Status: DC | PRN
  Administered 2021-10-23 (×2): 1 via ORAL
  Filled 2021-10-23 (×2): qty 1

## 2021-10-23 MED ORDER — DOCUSATE SODIUM 100 MG PO CAPS
100.0000 mg | ORAL_CAPSULE | Freq: Two times a day (BID) | ORAL | Status: DC
  Administered 2021-10-24: 100 mg via ORAL
  Filled 2021-10-23: qty 1

## 2021-10-23 MED ORDER — SOD CITRATE-CITRIC ACID 500-334 MG/5ML PO SOLN: 30.0000 mL | ORAL | Status: DC | PRN

## 2021-10-23 MED ORDER — ONDANSETRON HCL 4 MG/2ML IJ SOLN: 4.0000 mg | Freq: Four times a day (QID) | INTRAMUSCULAR | Status: DC | PRN

## 2021-10-23 MED ORDER — IBUPROFEN 600 MG PO TABS
600.0000 mg | ORAL_TABLET | Freq: Four times a day (QID) | ORAL | Status: DC
  Administered 2021-10-23 – 2021-10-24 (×6): 600 mg via ORAL
  Filled 2021-10-23 (×6): qty 1

## 2021-10-23 MED ORDER — OXYTOCIN-SODIUM CHLORIDE 30-0.9 UT/500ML-% IV SOLN
INTRAVENOUS | Status: AC
  Filled 2021-10-23: qty 500

## 2021-10-23 MED ORDER — WITCH HAZEL-GLYCERIN EX PADS
1.0000 | MEDICATED_PAD | CUTANEOUS | Status: DC | PRN
  Administered 2021-10-23: 1 via TOPICAL

## 2021-10-23 MED ORDER — BENZOCAINE-MENTHOL 20-0.5 % EX AERO
INHALATION_SPRAY | CUTANEOUS | Status: AC
  Filled 2021-10-23: qty 56

## 2021-10-23 MED ORDER — WITCH HAZEL-GLYCERIN EX PADS
MEDICATED_PAD | CUTANEOUS | Status: AC
  Filled 2021-10-23: qty 100

## 2021-10-23 MED ORDER — LIDOCAINE HCL (PF) 1 % IJ SOLN: 30.0000 mL | INTRAMUSCULAR | Status: DC | PRN

## 2021-10-23 MED ORDER — BENZOCAINE-MENTHOL 20-0.5 % EX AERO
1.0000 | INHALATION_SPRAY | CUTANEOUS | Status: DC | PRN
Start: 2021-10-23 — End: 2021-10-24
  Administered 2021-10-23: 1 via TOPICAL

## 2021-10-23 MED ORDER — ONDANSETRON HCL 4 MG PO TABS: 4.0000 mg | ORAL_TABLET | ORAL | Status: DC | PRN

## 2021-10-23 MED ORDER — FERROUS SULFATE 325 (65 FE) MG PO TABS
325.0000 mg | ORAL_TABLET | Freq: Every day | ORAL | Status: DC
  Administered 2021-10-23 – 2021-10-24 (×2): 325 mg via ORAL
  Filled 2021-10-23 (×2): qty 1

## 2021-10-23 MED ORDER — ONDANSETRON HCL 4 MG/2ML IJ SOLN: 4.0000 mg | INTRAMUSCULAR | Status: DC | PRN

## 2021-10-23 NOTE — Lactation Note (Signed)
This note was copied from a baby's chart. Lactation Consultation Note  Patient Name: Amanda Buck [id number] Date: 10/23/2021 Reason for consult: Initial assessment;1st time breastfeeding;Term Age:25 hours  Maternal Data This is mom's 2nd baby, NSVD.Mom with history of anemia and obesity. This is her first time breastfeeding.  Has patient been taught Hand Expression?: Yes Does the patient have breastfeeding experience prior to this delivery?: No  Feeding Mother's Current Feeding Choice: Breast Milk Assisted mother with maximizing position and latch technique. Encouraged mom to support her breast and sandwhich her breast to encourage a deeper latch. Mom initially reported some pinching when latching baby but after she tried recommendations she was comfortable and without discomfort with latch. Baby with multiple audible swallows and mom could identify those swallows.  LATCH Score Latch: Grasps breast easily, tongue down, lips flanged, rhythmical sucking. (Assisted mom with maximizing latch technique by "sandwiching her breast.")  Audible Swallowing: Spontaneous and intermittent  Type of Nipple: Everted at rest and after stimulation  Comfort (Breast/Nipple): Soft / non-tender  Hold (Positioning): Assistance needed to correctly position infant at breast and maintain latch.  LATCH Score: 9   Lactation Tools Discussed/Used  Manual pump.  Interventions Interventions: Breast feeding basics reviewed;Assisted with latch;Breast massage;Breast compression;Adjust position;Support pillows;Position options;Hand pump;Education  Discharge Discharge Education: Engorgement and breast care;Warning signs for feeding baby;Other (comment) (Provided mom with [facility] lactation information if she needs additional assistance once she goes home.) Pump: Manual (Mom does not have a pump for home use. Provided and instructed mom on use of manual pump including how to clean parts and milk storage.) WIC  Program: No Mom plans to apply for [facility] in [facility]. Provided mom with info needed to apply for [facility].  Consult Status Consult Status: Complete  Update provided to care nurse.  Fuller Song 10/23/2021, 6:10 PM

## 2021-10-23 NOTE — H&P (Signed)
History and Physical   HPI  Amanda Buck is a 26 y.o. G2P1001 at [redacted]w[redacted]d Estimated Date of Delivery: 10/30/21 who is being admitted for labor management   OB History  OB History  Gravida Para Term Preterm AB Living  2 1 1  0 0 1  SAB IAB Ectopic Multiple Live Births  0 0 0 0 1    # Outcome Date GA Lbr Len/2nd Weight Sex Delivery Anes PTL Lv  2 Current           1 Term 11/30/15 [redacted]w[redacted]d 20:35 / 00:51 3350 g F Vag-Spont EPI  LIV     Name: Stofko,GIRL Sanja     Apgar1: 8  Apgar5: 9    PROBLEM LIST  Pregnancy complications or risks: Patient Active Problem List   Diagnosis Date Noted   Anemia affecting pregnancy in third trimester 08/14/2021   Supervision of high risk pregnancy, antepartum 05/20/2021   Obesity affecting pregnancy 05/20/2021   Antenatal screening encounter 05/20/2021   Asthma 05/31/2018    Prenatal labs and studies: ABO, Rh: A/Positive/-- (03/02 1544) Antibody: Negative (03/02 1544) Rubella: 2.56 (03/02 1544) RPR: Non Reactive (05/08 0957)  HBsAg: Negative (03/02 1544)  HIV: Non Reactive (05/08 0957)  GBS: Collected 10/19/21 -not resulted.   Past Medical History:  Diagnosis Date   Anemia affecting pregnancy    Appendicitis 12/27/2016   Asthma    No current treatment needed   Headache(784.0)    Obesity (BMI 35.0-39.9 without comorbidity) 05/31/2018   BMI 38.28 kg/m2   Obesity affecting pregnancy in third trimester, antepartum      Past Surgical History:  Procedure Laterality Date   LAPAROSCOPIC APPENDECTOMY N/A 12/28/2016   Procedure: APPENDECTOMY LAPAROSCOPIC;  Surgeon: 12/30/2016, MD;  Location: [facility];  Service: General;  Laterality: N/A;     Medications    Current Discharge Medication List     CONTINUE these medications which have NOT CHANGED   Details  ferrous sulfate (FERROUSUL) 325 (65 FE) MG tablet Take 1 tablet (325 mg total) by mouth every other day. Qty: 60 tablet, Refills: 1   Associated Diagnoses: Anemia affecting  pregnancy in third trimester         Allergies  Vicodin [hydrocodone-acetaminophen]  Review of Systems  Constitutional: negative Eyes: negative Ears, nose, mouth, throat, and face: negative Respiratory: negative Cardiovascular: negative Gastrointestinal: negative Genitourinary:negative Integument/breast: negative Hematologic/lymphatic: negative Musculoskeletal:negative Neurological: negative Behavioral/Psych: negative Endocrine: negative Allergic/Immunologic: negative  Physical Exam  LMP 02/18/2021 (Within Weeks)   Lungs:  CTA B Cardio: RRR without M/R/G Abd: Soft, gravid, NT Presentation: cephalic EXT: No C/C/ 1+ Edema DTRs: 2+ B CERVIX: Dilation: 6.5 Effacement (%): 90 Station: -2, -1 Presentation: Vertex Exam by:: [email or web address], RN  See Prenatal records for more detailed PE.     FHR:  Baseline: 110 bpm, Variability: Good {> 6 bpm), Accelerations: Reactive, and Decelerations: Early  Toco: Uterine Contractions: Frequency: Every 2-3 minutes  Test Results  No results found for this or any previous visit (from the past 24 hour(s)). Other: GBS not resulted, to contract lab in am for results  Assessment   G2P1001 at [redacted]w[redacted]d Estimated Date of Delivery: 10/30/21  The fetus is reassuring.   Patient Active Problem List   Diagnosis Date Noted   Anemia affecting pregnancy in third trimester 08/14/2021   Supervision of high risk pregnancy, antepartum 05/20/2021   Obesity affecting pregnancy 05/20/2021   Antenatal screening encounter 05/20/2021   Asthma 05/31/2018    Plan  1. Admit to L&D :   2. EFM:-- Category 1 3. Stadol or Epidural if desired.   4. Admission labs  5. Anticipate NSVD  Doreene Burke, CNM  10/23/2021 12:04 AM

## 2021-10-23 NOTE — Progress Notes (Signed)
Obstetric Postpartum Daily Progress Note Subjective:  26 y.o. Amanda Buck postpartum day #0 status post vaginal delivery.  She is ambulating, is tolerating po, is voiding spontaneously.  Her pain is well controlled on PO pain medications.-needed oxy for cramping. Her lochia is less than menses. Breastfeeding independently, having some pain wit latch, working with LC. Lots of family at the bedside.    Medications SCHEDULED MEDICATIONS   [START ON 10/24/2021] docusate sodium  100 mg Oral BID   ferrous sulfate  325 mg Oral Q breakfast   ibuprofen  600 mg Oral Q6H   prenatal multivitamin  1 tablet Oral Q1200   senna-docusate  2 tablet Oral Q24H   [START ON 10/24/2021] Tdap  0.5 mL Intramuscular Once    MEDICATION INFUSIONS   lactated ringers     lactated ringers     lactated ringers     oxytocin Stopped (10/23/21 0420)    PRN MEDICATIONS  benzocaine-Menthol, coconut oil, witch hazel-glycerin **AND** dibucaine, lactated ringers, lidocaine (PF), ondansetron, ondansetron **OR** ondansetron (ZOFRAN) IV, oxyCODONE-acetaminophen, simethicone, sodium citrate-citric acid    Objective:   Vitals:   10/23/21 0220 10/23/21 0326 10/23/21 0735 10/23/21 1131  BP: 124/76 119/84 123/84 126/85  Pulse: 75 72 68 72  Resp: 18 18 20 20   Temp: (!) 97 F (36.1 C) (!) 97.5 F (36.4 C) 97.8 F (36.6 C) 98 F (36.7 C)  TempSrc:   Oral Oral  SpO2: 100% 99% 100%     Current Vital Signs 24h Vital Sign Ranges  T 98 F (36.7 C) Temp  Avg: 97.6 F (36.4 C)  Min: 97 F (36.1 C)  Max: 98 F (36.7 C)  BP 126/85 BP  Min: 119/84  Max: 126/85  HR 72 Pulse  Avg: 74.8  Min: 68  Max: 84  RR 20 Resp  Avg: 19  Min: 18  Max: 20  SaO2 100 % Room Air SpO2  Avg: 99.7 %  Min: 99 %  Max: 100 %       24 Hour I/O Current Shift I/O  Time Ins Outs 07/20 0701 - 07/21 0700 In: -  Out: 330  07/21 0701 - 07/21 1900 In: -  Out: 900 [Urine:900]  General: NAD Pulmonary: no increased work of breathing Breasts: soft, no redness,  nipples erect and intact bilaterally  Abdomen: non-distended, non-tender, fundus firm at level of umbilicus Extremities: no edema, no erythema, no tenderness  Labs:  Recent Labs  Lab 10/22/21 2353  WBC 11.5*  HGB 12.8  HCT 39.2  PLT 252     Assessment:   26 y.o. G2P2002 postpartum day # 0 status post SVB  Plan:   1) Acute blood loss anemia - hemodynamically stable and asymptomatic - po ferrous sulfate  2) A POS / Rubella 2.56 (03/02 1544)/ Varicella Immune  3) TDAP status declined  4) breast /Contraception = IUD  5) Disposition Discharge 10/24/21  10/26/21 [facility], CNM  10/23/2021 3:18 PM

## 2021-10-23 NOTE — Discharge Summary (Signed)
Postpartum Discharge Summary  Date of Service updated: 10/24/2021     Patient Name: Amanda Buck DOB: [birth year] MRN: [id number]  Date of admission: 10/22/2021 Delivery date: 10/23/2021  Delivering provider: Philip Aspen  Date of discharge: 10/24/2021  Admitting diagnosis: Labor and delivery, indication for care [O75.9] Intrauterine pregnancy: [redacted]w[redacted]d     Secondary diagnosis:  Principal Problem:   Labor and delivery, indication for care Active Problems:   Postpartum care following vaginal delivery   Encounter for care or examination of lactating mother   [redacted] weeks gestation of pregnancy  Additional problems: Obesity in pregnancy    Discharge diagnosis: Term Pregnancy Delivered                                              Post partum procedures: none Augmentation: N/A Complications: None  Hospital course: Onset of Labor With Vaginal Delivery      26 y.o. yo G2P1001 at [redacted]w[redacted]d was admitted in Active Labor on 10/22/2021. Patient had an uncomplicated labor course as follows:  Membrane Rupture Time/Date: 11:20 PM ,10/23/2021   Delivery Method:Vaginal, Spontaneous  Episiotomy: None  Lacerations:  none See delivery note for details  Patient had an uncomplicated postpartum course.  She is tolerating regular diet, her pain is controlled with PO medication, she is ambulating and voiding without difficulty.    Patient is discharged home in stable condition on 10/24/21.  Newborn Data: Birth date:10/23/2021  Birth time:12:41 AM  Gender:Female  Living status:Living  Apgars:8 ,9 8,9 Weight:3920 g   Magnesium Sulfate received: No BMZ received: No Rhophylac:No MMR:No T-DaP: patient declined Flu: No Transfusion:No  Physical exam  Vitals:   10/23/21 1523 10/23/21 2200 10/23/21 2305 10/24/21 0729  BP: 126/80  (!) 136/92 115/79  Pulse: 78  75 73  Resp: $Remo'18  18 18  'KuwTd$ Temp: 98.3 F (36.8 C)   98.6 F (37 C)  TempSrc: Oral   Oral  SpO2: 99%  99% 100%  Weight:  113.9 kg      General: alert, cooperative, and no distress Lochia: appropriate Uterine Fundus: firm Incision: N/A DVT Evaluation: No evidence of DVT seen on physical exam. Labs: Lab Results  Component Value Date   WBC 11.5 (H) 10/22/2021   HGB 12.8 10/22/2021   HCT 39.2 10/22/2021   MCV 90.3 10/22/2021   PLT 252 10/22/2021      Latest Ref Rng & Units 08/26/2021   12:16 PM  CMP  Glucose 70 - 99 mg/dL 97   BUN 6 - 20 mg/dL <5   Creatinine 0.44 - 1.00 mg/dL 0.69   Sodium 135 - 145 mmol/L 135   Potassium 3.5 - 5.1 mmol/L 3.4   Chloride 98 - 111 mmol/L 105   CO2 22 - 32 mmol/L 22   Calcium 8.9 - 10.3 mg/dL 8.6    Edinburgh Score:    10/23/2021   12:34 PM  Edinburgh Postnatal Depression Scale Screening Tool  I have been able to laugh and see the funny side of things. 0  I have looked forward with enjoyment to things. 0  I have blamed myself unnecessarily when things went wrong. 2  I have been anxious or worried for no good reason. 2  I have felt scared or panicky for no good reason. 1  Things have been getting on top of me. 1  I have been so  unhappy that I have had difficulty sleeping. 0  I have felt sad or miserable. 2  I have been so unhappy that I have been crying. 0  The thought of harming myself has occurred to me. 0  Edinburgh Postnatal Depression Scale Total 8      After visit meds:  Allergies as of 10/24/2021       Reactions   Vicodin [hydrocodone-acetaminophen] Rash        Medication List     TAKE these medications    ferrous sulfate 325 (65 FE) MG tablet Commonly known as: FerrouSul Take 1 tablet (325 mg total) by mouth every other day.         Discharge home in stable condition Infant Feeding: Breast and formula Infant Disposition:home with mother Discharge instruction: per After Visit Summary and Postpartum booklet. Activity: Advance as tolerated. Pelvic rest for 6 weeks.  Diet: routine diet Anticipated Birth Control: IUD Postpartum Appointment:1  week Additional Postpartum F/U:  6 weeks  Future Appointments: Future Appointments  Date Time Provider [place]  11/11/2021  3:35 PM Dominic, Nunzio Cobbs, CNM [facility] None  11/30/2021 10:55 AM Dominic, Nunzio Cobbs, CNM [facility] None   Follow up Visit:  Follow-up Information     [facility]. Go to.   Specialty: Obstetrics and Gynecology Why: scheduled appointment and 6 week postpartum appointment/IUD insertion Contact information: [address] [place] [address] [phone]                  10/24/2021 Rod Can, CNM

## 2021-10-23 NOTE — Telephone Encounter (Signed)
Patient is scheduled for 6 week pp and mirena placement 11/30/21 at 10:55 am .

## 2021-10-23 NOTE — Telephone Encounter (Signed)
Noted. Will order to arrive by appointment date/time. 

## 2021-10-24 DIAGNOSIS — Z3A39 39 weeks gestation of pregnancy: Secondary | ICD-10-CM

## 2021-10-24 NOTE — Progress Notes (Signed)
Patient discharged with infant. Discharge instructions, prescriptions, and follow up appointments given to and reviewed with patient. Patient verbalized understanding. Escorted out by NT.  °

## 2021-10-26 ENCOUNTER — Encounter: Payer: Medicaid Other | Admitting: Licensed Practical Nurse

## 2021-10-29 ENCOUNTER — Other Ambulatory Visit: Payer: Medicaid Other

## 2021-10-29 ENCOUNTER — Ambulatory Visit: Payer: Medicaid Other

## 2021-10-30 ENCOUNTER — Ambulatory Visit: Payer: Medicaid Other

## 2021-11-11 ENCOUNTER — Ambulatory Visit (INDEPENDENT_AMBULATORY_CARE_PROVIDER_SITE_OTHER): Payer: Medicaid Other | Admitting: Licensed Practical Nurse

## 2021-11-11 ENCOUNTER — Encounter: Payer: Self-pay | Admitting: Licensed Practical Nurse

## 2021-11-11 DIAGNOSIS — Z1332 Encounter for screening for maternal depression: Secondary | ICD-10-CM

## 2021-11-11 DIAGNOSIS — Z111 Encounter for screening for respiratory tuberculosis: Secondary | ICD-10-CM | POA: Diagnosis not present

## 2021-11-11 NOTE — Progress Notes (Unsigned)
Postpartum Visit  Chief Complaint:  Chief Complaint  Patient presents with   Postpartum Care    History of Present Illness: Patient is a 26 y.o. [id number] presents for postpartum visit.  Date of delivery: 10/23/2021 Type of delivery: Vaginal delivery - Vacuum or forceps assisted  no Episiotomy No.  Laceration: no  Pregnancy or labor problems:  no Any problems since the delivery:  no Doing ok, admits be being tired, infant does not sleep well has his days and nights reversed.  Mood has been good but irritable No concerns with voiding, stooling, or perineum Breastfeeeding and pumping Bleeding has slowed down Does not desire another pregnancy, plans IUD  Newborn Details:  SINGLETON :  1. Baby's name: boy. Birth weight: 3920grams Maternal Details:  Breast Feeding:  yes Post partum depression/anxiety noted:  no Edinburgh Post-Partum Depression Score:  4  Date of last PAP: 05/20/2021  normal   Past Medical History:  Diagnosis Date   Anemia affecting pregnancy    Appendicitis 12/27/2016   Asthma    No current treatment needed   Headache(784.0)    Obesity (BMI 35.0-39.9 without comorbidity) 05/31/2018   BMI 38.28 kg/m2   Obesity affecting pregnancy in third trimester, antepartum     Past Surgical History:  Procedure Laterality Date   LAPAROSCOPIC APPENDECTOMY N/A 12/28/2016   Procedure: APPENDECTOMY LAPAROSCOPIC;  Surgeon: Berna Bue, MD;  Location: [facility];  Service: General;  Laterality: N/A;    Prior to Admission medications   Medication Sig Start Date End Date Taking? Authorizing Provider  ferrous sulfate (FERROUSUL) 325 (65 FE) MG tablet Take 1 tablet (325 mg total) by mouth every other day. 08/14/21   Federico Flake, MD    Allergies  Allergen Reactions   Vicodin [Hydrocodone-Acetaminophen] Rash     Social History   Socioeconomic History   Marital status: Single    Spouse name: Not on file   Number of children: 1   Years of education: Not on file    Highest education level: Not on file  Occupational History   Occupation: Folder    Employer: [employer]  Tobacco Use   Smoking status: Never   Smokeless tobacco: Never  Vaping Use   Vaping Use: Never used  Substance and Sexual Activity   Alcohol use: No   Drug use: No   Sexual activity: Yes    Partners: Male    Birth control/protection: I.U.D.  Other Topics Concern   Not on file  Social History Narrative   Right handed   Lives with family one story home   Social Determinants of Health   Financial Resource Strain: Not on file  Food Insecurity: Not on file  Transportation Needs: Not on file  Physical Activity: Insufficiently Active (05/31/2018)   Exercise Vital Sign    Days of Exercise per Week: 2 days    Minutes of Exercise per Session: 30 min  Stress: Not on file  Social Connections: Not on file  Intimate Partner Violence: Not on file    Family History  Problem Relation Age of Onset   ALS Paternal Grandmother        Died at 44   Heart failure Paternal Grandfather        Died at 63    Review of Systems  Constitutional:        Tired   Respiratory: Negative.    Cardiovascular: Negative.   Gastrointestinal: Negative.   Genitourinary: Negative.   Musculoskeletal: Negative.   Neurological: Negative.  Psychiatric/Behavioral: Negative.       Physical Exam BP 122/70   Wt 226 lb (102.5 kg)   Breastfeeding Yes   BMI 38.79 kg/m   Physical Exam Constitutional:      Appearance: Normal appearance.  Pulmonary:     Breath sounds: Normal breath sounds.  Abdominal:     Tenderness: There is no abdominal tenderness.     Comments: Uterus not palpable in abd   Musculoskeletal:     Cervical back: Normal range of motion and neck supple.     Right lower leg: No edema.     Left lower leg: No edema.  Neurological:     Mental Status: She is alert.  Skin:    General: Skin is warm.  Psychiatric:        Mood and Affect: Mood normal.      Female  Chaperone present during breast and/or pelvic exam.  Assessment: 26 y.o. G3O7564 presenting for 2 week postpartum visit  Plan: Problem List Items Addressed This Visit   None Visit Diagnoses     Screening for tuberculosis    -  Primary   Relevant Orders   QuantiFERON-TB Gold Plus        1) Contraception Education given regarding options for contraception, including IUD placement.  2)  Pap - ASCCP guidelines and rational discussed.  Patient opts for 3 year screening interval due 2026   3) Patient underwent screening for postpartum depression with NO concerns noted.  4) Follow up in 4 wk for PP visit   5) TB test collected per pt request for work   [employer], [facility]  11/12/21  6:00 PM

## 2021-11-14 LAB — QUANTIFERON-TB GOLD PLUS
QuantiFERON Mitogen Value: >10 IU/mL
QuantiFERON Nil Value: 0 IU/mL
QuantiFERON TB1 Ag Value: 0.01 IU/mL
QuantiFERON TB2 Ag Value: 0.01 IU/mL
QuantiFERON-TB Gold Plus: NEGATIVE

## 2021-11-14 IMAGING — US US PELVIS COMPLETE WITH TRANSVAGINAL
1 series · 13 of 25 positions shown · non-contrast
Comparison: None available.

CLINICAL DATA: Initial evaluation for acute pelvic pain for 1
month.



[Series 1: us pelvis complete with transvaginal · 0.25mm/px · 13 of 123 slices shown]
[im 1/123]
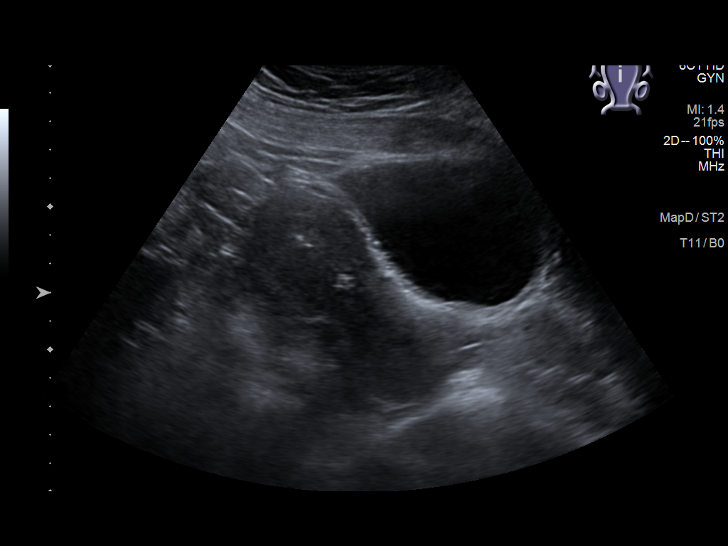
[im 11/123]
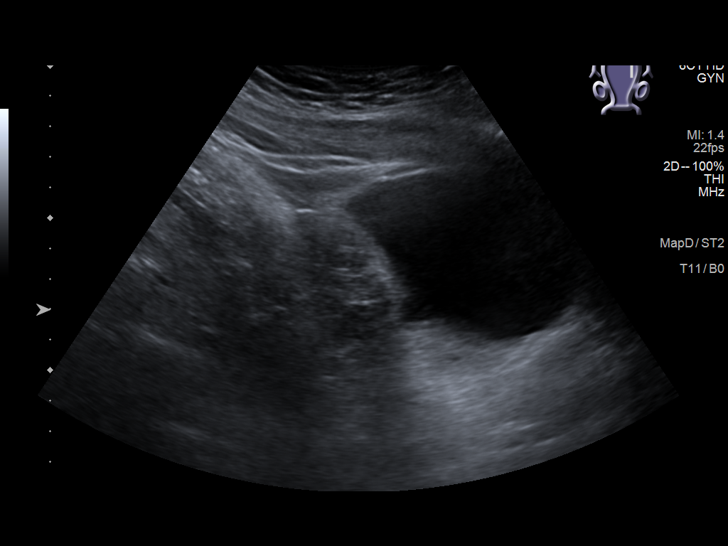
[im 21/123]
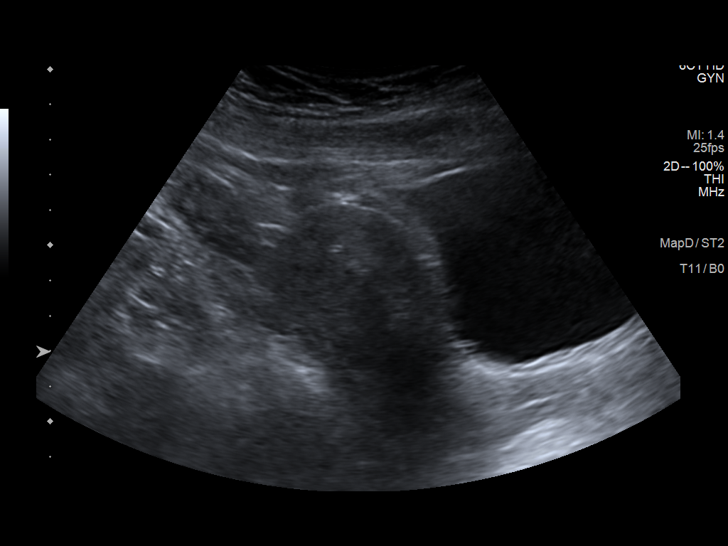
[im 31/123]
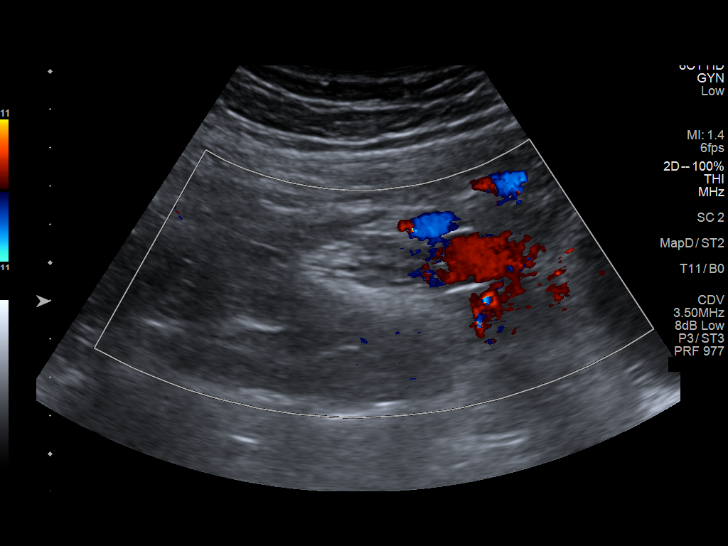
[im 41/123]
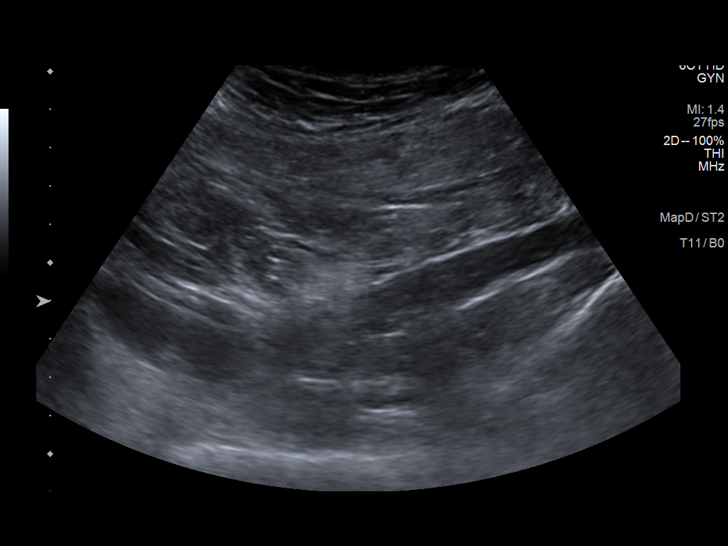
[im 51/123]
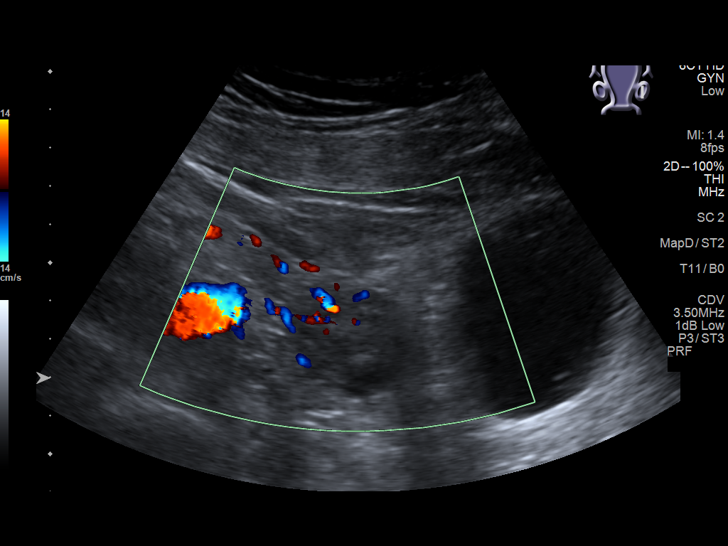
[im 62/123]
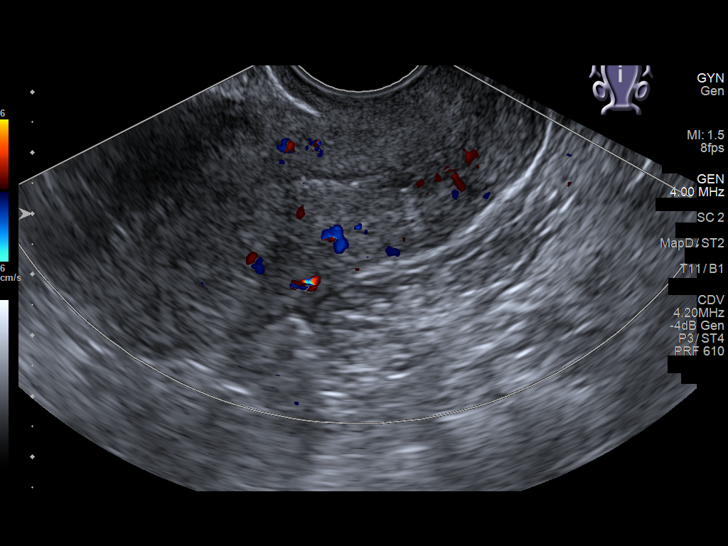
[im 72/123]
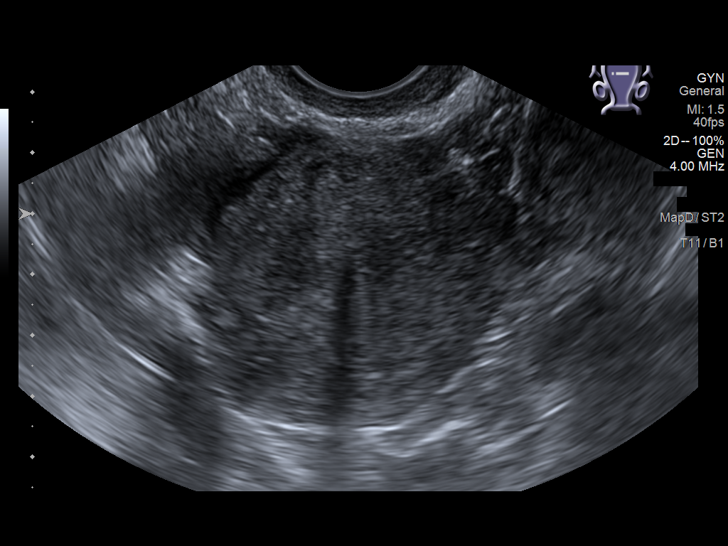
[im 82/123]
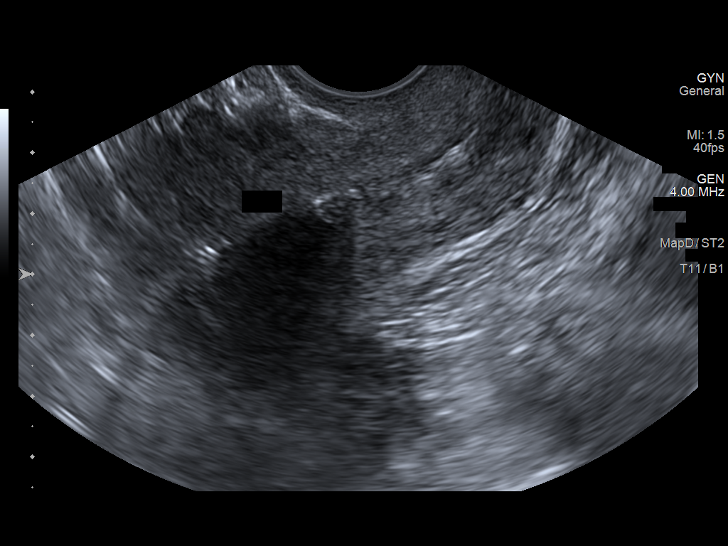
[im 92/123]
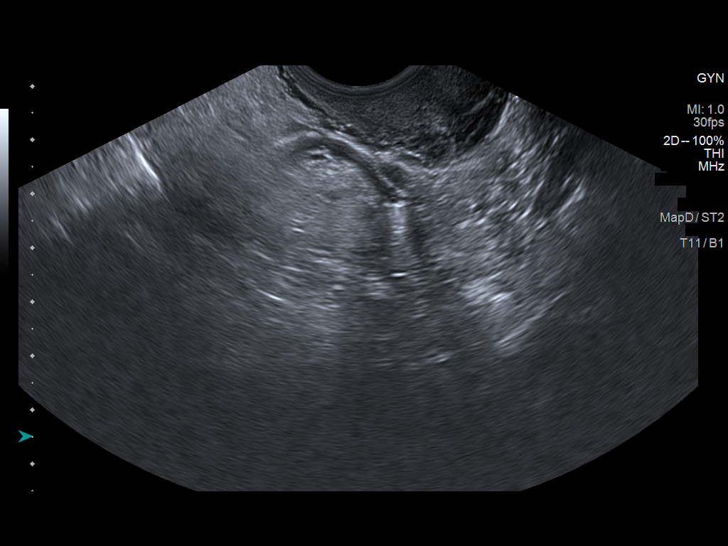
[im 102/123]
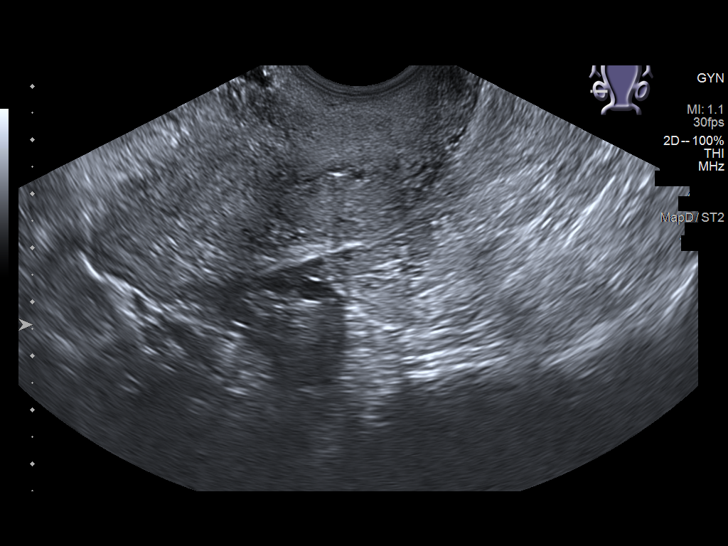
[im 112/123]
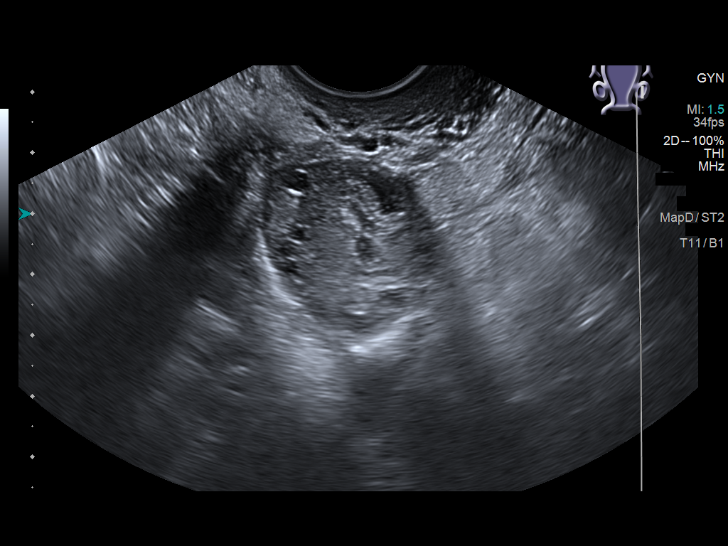
[im 123/123]
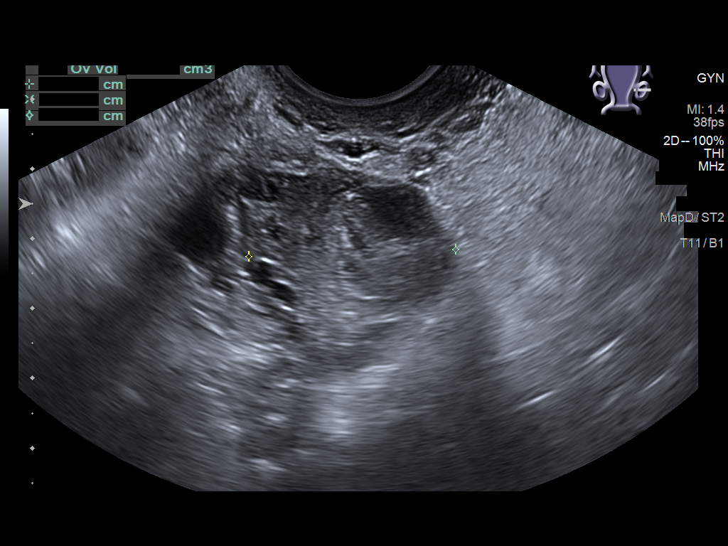

[13 of 25 positions shown; findings below may reference images not displayed]

FINDINGS: Uterus

Measurements: 8.3 x 3.9 x 4.8 cm = volume: 81.6 mL. Uterus is
anteverted. No discrete fibroid or other mass.

Endometrium

Thickness: 6 mm. No focal abnormality visualized. IUD in appropriate
position within the endometrial cavity at the level of the uterine
fundus/body.

Right ovary

Measurements: 2.2 x 1.7 x 1.9 cm = volume: 3.7 mL. Normal
appearance/no adnexal mass.

Left ovary

Measurements: 2.9 x 2.6 x 3.0 cm = volume: 11.5 mL. 1.5 x 1.5 x
cm degenerating corpus luteal cyst noted. No other adnexal mass.

Other findings

Small volume free fluid within the pelvis.
IMPRESSION: 1. 1.5 cm degenerating left ovarian corpus luteal cyst with
associated small volume free fluid within the pelvis.
2. IUD in appropriate position within the endometrial cavity at the
level of the uterine fundus/body.
3. Otherwise unremarkable and normal pelvic ultrasound. No other
findings to explain patient's symptoms identified.

## 2021-11-30 ENCOUNTER — Encounter: Payer: Self-pay | Admitting: Licensed Practical Nurse

## 2021-11-30 ENCOUNTER — Ambulatory Visit (INDEPENDENT_AMBULATORY_CARE_PROVIDER_SITE_OTHER): Payer: Medicaid Other | Admitting: Licensed Practical Nurse

## 2021-11-30 VITALS — BP 110/80 | Ht 64.0 in | Wt 232.0 lb

## 2021-11-30 DIAGNOSIS — Z30430 Encounter for insertion of intrauterine contraceptive device: Secondary | ICD-10-CM

## 2021-11-30 LAB — POCT URINE PREGNANCY: Preg Test, Ur: NEGATIVE

## 2021-11-30 MED ORDER — LEVONORGESTREL 20 MCG/DAY IU IUD
1.0000 | INTRAUTERINE_SYSTEM | Freq: Once | INTRAUTERINE | Status: AC
  Administered 2021-11-30: 1 via INTRAUTERINE

## 2021-11-30 NOTE — Progress Notes (Signed)
Postpartum Visit  Chief Complaint:  Chief Complaint  Patient presents with   Postpartum Care   Contraception    Mirena vs Nexplanon    History of Present Illness: Patient is a 26 y.o. [id number] presents for postpartum visit. Date of delivery: 10/23/2021 Type of delivery: Vaginal delivery - Vacuum or forceps assisted  no Episiotomy No.  Laceration: no  Pregnancy or labor problems:  no Any problems since the delivery:  no Doing well, feels much better since last visit, sleep has improved Mood "feels great" No concerns with voiding, stooling, or perineum Bleeding has stopped  Had IC, it was fine  Does not desire another pregnancy, desires Mirena today  Is active home  Last dental exam 2 years ago Last eye exam 1-2 years ago   Newborn Details:  SINGLETON :  1. Baby's name: boy. Birth weight: 3920grams Maternal Details:  Breast Feeding:  yes Post partum depression/anxiety noted:  no Edinburgh Post-Partum Depression Score:  0 Date of last PAP: 05/20/2021  normal     Past Medical History:  Diagnosis Date   Anemia affecting pregnancy    Appendicitis 12/27/2016   Asthma    No current treatment needed   Headache(784.0)    Obesity (BMI 35.0-39.9 without comorbidity) 05/31/2018   BMI 38.28 kg/m2   Obesity affecting pregnancy in third trimester, antepartum     Past Surgical History:  Procedure Laterality Date   LAPAROSCOPIC APPENDECTOMY N/A 12/28/2016   Procedure: APPENDECTOMY LAPAROSCOPIC;  Surgeon: Amanda Bue, MD;  Location: [facility];  Service: General;  Laterality: N/A;    Prior to Admission medications   Medication Sig Start Date End Date Taking? Authorizing Provider  ferrous sulfate (FERROUSUL) 325 (65 FE) MG tablet Take 1 tablet (325 mg total) by mouth every other day. 08/14/21  Yes Amanda Flake, MD    Allergies  Allergen Reactions   Vicodin [Hydrocodone-Acetaminophen] Rash     Social History   Socioeconomic History   Marital status: Single     Spouse name: Not on file   Number of children: 1   Years of education: Not on file   Highest education level: Not on file  Occupational History   Occupation: Folder    Employer: [employer]  Tobacco Use   Smoking status: Never   Smokeless tobacco: Never  Vaping Use   Vaping Use: Never used  Substance and Sexual Activity   Alcohol use: No   Drug use: No   Sexual activity: Yes    Partners: Male    Birth control/protection: None  Other Topics Concern   Not on file  Social History Narrative   Right handed   Lives with family one story home   Social Determinants of Health   Financial Resource Strain: Not on file  Food Insecurity: Not on file  Transportation Needs: Not on file  Physical Activity: Insufficiently Active (05/31/2018)   Exercise Vital Sign    Days of Exercise per Week: 2 days    Minutes of Exercise per Session: 30 min  Stress: Not on file  Social Connections: Not on file  Intimate Partner Violence: Not on file    Family History  Problem Relation Age of Onset   ALS Paternal Grandmother        Died at 84   Heart failure Paternal Grandfather        Died at 37    Review of Systems  Constitutional: Negative.   Respiratory: Negative.    Cardiovascular: Negative.  Gastrointestinal: Negative.   Genitourinary: Negative.   Musculoskeletal: Negative.   Neurological: Negative.   Endo/Heme/Allergies: Negative.   Psychiatric/Behavioral: Negative.       Physical Exam BP 110/80   Ht 5\' 4"  (1.626 m)   Wt 232 lb (105.2 kg)   Breastfeeding Yes   BMI 39.82 kg/m   Physical Exam Constitutional:      Appearance: Normal appearance.  Genitourinary:     Vulva normal.     Genitourinary Comments: Cervix pink, no lesions Bimanual exam: uterus non tender, no masses, non gravid size, adnexa non tender no masses, not enlarged  Good vaginal tone   HENT:     Head: Normocephalic.  Cardiovascular:     Rate and Rhythm: Normal rate and regular rhythm.   Pulmonary:     Effort: Pulmonary effort is normal.     Breath sounds: Normal breath sounds.  Chest:     Comments: Breasts: soft, no redness or masses.  Nipples erect and intact bilaterally  Abdominal:     General: Abdomen is flat. There is no distension.     Tenderness: There is no abdominal tenderness.  Musculoskeletal:        General: No swelling. Normal range of motion.     Cervical back: Normal range of motion and neck supple.  Neurological:     General: No focal deficit present.     Mental Status: She is alert.  Skin:    General: Skin is warm.  Psychiatric:        Mood and Affect: Mood normal.      Female Chaperone present during breast and/or pelvic exam.    GYNECOLOGY OFFICE PROCEDURE NOTE  Amanda Buck is a 26 y.o. 26 here for Mirena IUD insertion. No GYN concerns.  Last pap smear was on 2023 and was normal.  The patient is currently using PP for contraception and her LMP is No LMP recorded..  The indication for her IUD is contraception/cycle control.  IUD Insertion Procedure Note Patient identified, informed consent performed, consent signed.   Discussed risks of irregular bleeding, cramping, infection, malpositioning, expulsion or uterine perforation of the IUD (1:1000 placements)  which may require further procedure such as laparoscopy.  IUD while effective at preventing pregnancy do not prevent transmission of sexually transmitted diseases and use of barrier methods for this purpose was discussed. Time out was performed.  Urine pregnancy test negative.  Speculum placed in the vagina.  Cervix visualized.  Cleaned with Betadine x 2.  Grasped first posteriorly then anteriorly with a single tooth tenaculum.  Uterus sounded to 7 cm. IUD placed per manufacturer's recommendations.  Strings trimmed to 3 cm. Tenaculum was removed, good hemostasis noted.  Patient tolerated procedure well.   Patient was given post-procedure instructions.  She was advised to have backup  contraception for one week.  Patient was also asked to check IUD strings periodically and follow up in 6 weeks for IUD check.  2024, CNM  [facility], [facility]   Assessment: 26 y.o. [phone] presenting for 6 week postpartum visit  Plan: Problem List Items Addressed This Visit   None    1) Contraception IUD inserted today, see note   2)  Pap - ASCCP guidelines and rational discussed.  Patient opts for 3 year screening interval  3) Patient underwent screening for postpartum depression with NO concerns noted.  4) Follow up 1 year for routine annual exam  [id number], CNM  Amanda Buck, [facility]  11/30/21  1:07 PM

## 2021-12-08 ENCOUNTER — Encounter: Payer: Self-pay | Admitting: Licensed Practical Nurse

## 2022-04-07 ENCOUNTER — Other Ambulatory Visit (HOSPITAL_COMMUNITY)
Admission: RE | Admit: 2022-04-07 | Discharge: 2022-04-07 | Disposition: A | Discharge status: Home / Self Care | Payer: Medicaid Other | Source: Ambulatory Visit | Attending: Licensed Practical Nurse | Admitting: Licensed Practical Nurse

## 2022-04-07 ENCOUNTER — Ambulatory Visit (INDEPENDENT_AMBULATORY_CARE_PROVIDER_SITE_OTHER): Payer: Medicaid Other

## 2022-04-07 VITALS — BP 126/82 | HR 81 | Resp 16 | Wt 288.7 lb

## 2022-04-07 DIAGNOSIS — N898 Other specified noninflammatory disorders of vagina: Secondary | ICD-10-CM | POA: Diagnosis not present

## 2022-04-07 DIAGNOSIS — M545 Low back pain, unspecified: Secondary | ICD-10-CM | POA: Diagnosis not present

## 2022-04-07 LAB — POCT URINALYSIS DIPSTICK
Bilirubin, UA: NEGATIVE
Blood, UA: NEGATIVE
Glucose, UA: NEGATIVE
Ketones, UA: NEGATIVE
Leukocytes, UA: NEGATIVE
Nitrite, UA: NEGATIVE
Protein, UA: POSITIVE — AB
Spec Grav, UA: >=1.03 — AB (ref 1.010–1.025)
Urobilinogen, UA: 0.2 E.U./dL
pH, UA: 5 (ref 5.0–8.0)

## 2022-04-07 NOTE — Progress Notes (Signed)
Subjective:    Amanda Buck is a 27 y.o. female who complains of abnormal smelling urine and vaginal discomfort  for 3 days.  Patient also complains of back pain and vaginal discharge. Patient denies congestion, cough, fever, headache, rhinitis, sorethroat, and stomach ache.  Patient does not have a history of recurrent UTI.  Patient does not have a history of pyelonephritis. The following portions of the patient's history were reviewed and updated as appropriate: allergies and current medications. Review of Systems Pertinent items are noted in HPI.    Objective:    BP 126/82   Pulse 81   Resp 16   Wt 288 lb 11.2 oz (131 kg)   LMP  (Within Days)   SpO2 98%   Breastfeeding No   BMI 49.56 kg/m  General: alert and cooperative            Laboratory:  Urine dipstick shows negative for all components.   Micro exam: not done.  Assessment:    Vaginitis    Plan: Plan:    1. Medications: not indicated at this time 2. Maintain adequate hydration 3. Follow up if symptoms not improving, and prn.   Nunzio Cory. W, NCMA

## 2022-04-07 NOTE — Patient Instructions (Signed)
Vaginitis  Vaginitis is irritation and swelling of the vagina. Treatment will depend on the cause. What are the causes? It can be caused by: Bacteria. Yeast. A parasite. A virus. Low hormone levels. Bubble baths, scented tampons, and feminine sprays. Other things can change the balance of the yeast and bacteria that live in the vagina. These include: Antibiotic medicines. Not being clean enough. Some birth control methods. Sex. Infection. Diabetes. A weakened body defense system (immune system). What increases the risk? Smoking or being around someone who smokes. Using washes (douches), scented tampons, or scented pads. Wearing tight pants or thong underwear. Using birth control pills or an IUD. Having sex without a condom or having a lot of partners. Having an STI. Using a certain product to kill sperm (nonoxynol-9). Eating foods that are high in sugar. Having diabetes. Having low levels of a female hormone. Having a weakened body defense system. Being pregnant or breastfeeding. What are the signs or symptoms? Fluid coming from the vagina that is not normal. A bad smell. Itching, pain, or swelling. Pain with sex. Pain or burning when you pee (urinate). Sometimes there are no symptoms. How is this treated? Treatment may include: Antibiotic creams or pills. Antifungal medicines. Medicines to ease symptoms if you have a virus. Your sex partner should also be treated. Estrogen medicines. Avoiding scented soaps, sprays, or douches. Stopping use of products that caused irritation and then using a cream to treat symptoms. Follow these instructions at home: Lifestyle Keep the area around your vagina clean and dry. Avoid using soap. Rinse the area with water. Until your doctor says it is okay: Do not use washes for the vagina. Do not use tampons. Do not have sex. Wipe from front to back after going to the bathroom. When your doctor says it is okay, practice safe sex  and use condoms. General instructions Take over-the-counter and prescription medicines only as told by your doctor. If you were prescribed an antibiotic medicine, take or use it as told by your doctor. Do not stop taking or using it even if you start to feel better. Keep all follow-up visits. How is this prevented? Do not use things that can irritate the vagina, such as fabric softeners. Avoid these products if they are scented: Sprays. Detergents. Tampons. Products for cleaning the vagina. Soaps or bubble baths. Let air reach your vagina. To do this: Wear cotton underwear. Do not wear: Underwear while you sleep. Tight pants. Thong underwear. Underwear or nylons without a cotton panel. Take off any wet clothing, such as bathing suits, as soon as you can. Practice safe sex and use condoms. Contact a doctor if: You have pain in your belly or in the area between your hips. You have a fever or chills. Your symptoms last for more than 2-3 days. Get help right away if: You have a fever and your symptoms get worse all of a sudden. Summary Vaginitis is irritation and swelling of the vagina. Treatment will depend on the cause of the condition. Do not use washes or tampons or have sex until your doctor says it is okay. This information is not intended to replace advice given to you by your health care provider. Make sure you discuss any questions you have with your health care provider. Document Revised: 09/20/2019 Document Reviewed: 09/20/2019 Elsevier Patient Education  2023 Elsevier Inc.  

## 2022-04-09 LAB — CERVICOVAGINAL ANCILLARY ONLY
Bacterial Vaginitis (gardnerella): NEGATIVE
Candida Glabrata: NEGATIVE
Candida Vaginitis: NEGATIVE
Chlamydia: NEGATIVE
Comment: NEGATIVE
Comment: NEGATIVE
Comment: NEGATIVE
Comment: NEGATIVE
Comment: NEGATIVE
Comment: NORMAL
Neisseria Gonorrhea: NEGATIVE
Trichomonas: NEGATIVE

## 2022-04-09 LAB — URINE CULTURE

## 2022-04-16 ENCOUNTER — Encounter: Payer: Self-pay | Admitting: Licensed Practical Nurse

## 2022-07-16 IMAGING — US US OB COMP +14 WK
1 series · 13 of 28 positions shown · non-contrast
Comparison: none

CLINICAL DATA: Incomplete anatomical survey

EXAM:
OBSTETRICAL ULTRASOUND >14 WKS AND BIOPHYSICAL PROFILE

[Series 1: ob us · 13 of 97 slices shown]
[im 4/97]
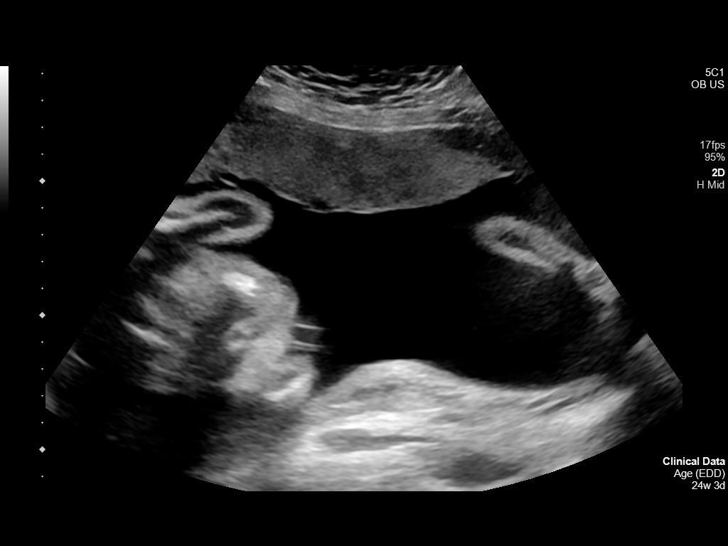
[im 11/97]
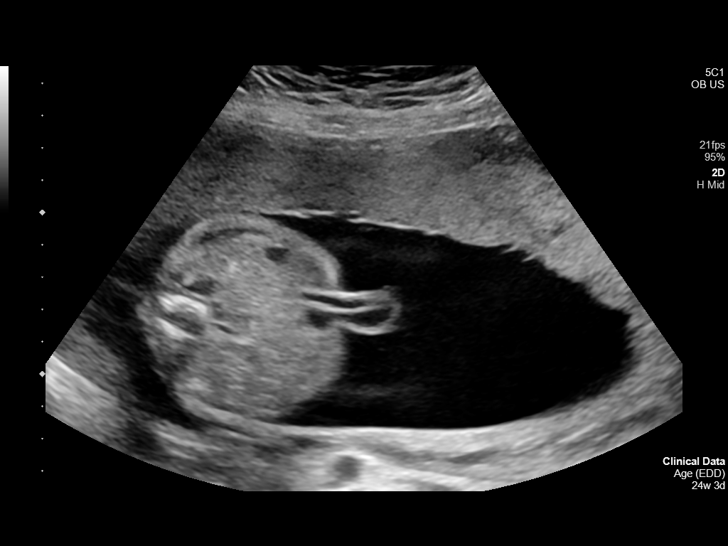
[im 18/97]
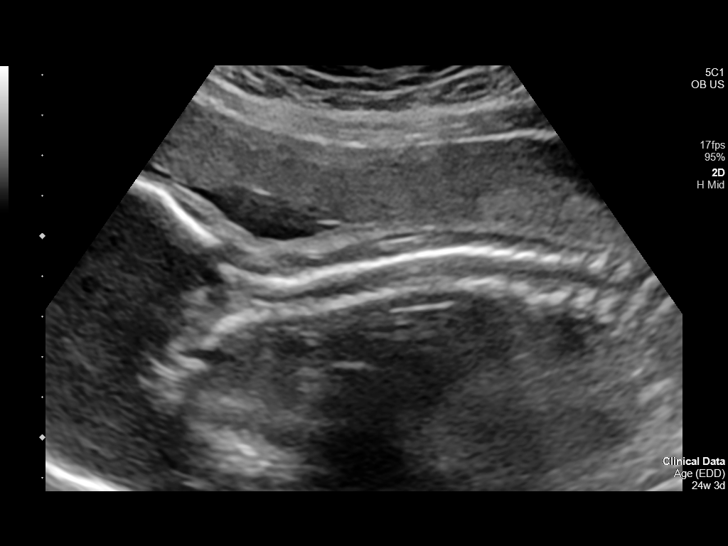
[im 25/97]
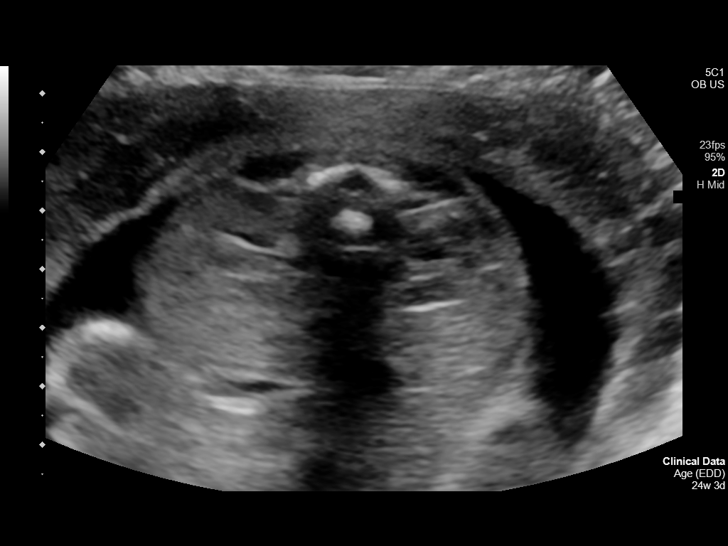
[im 33/97]
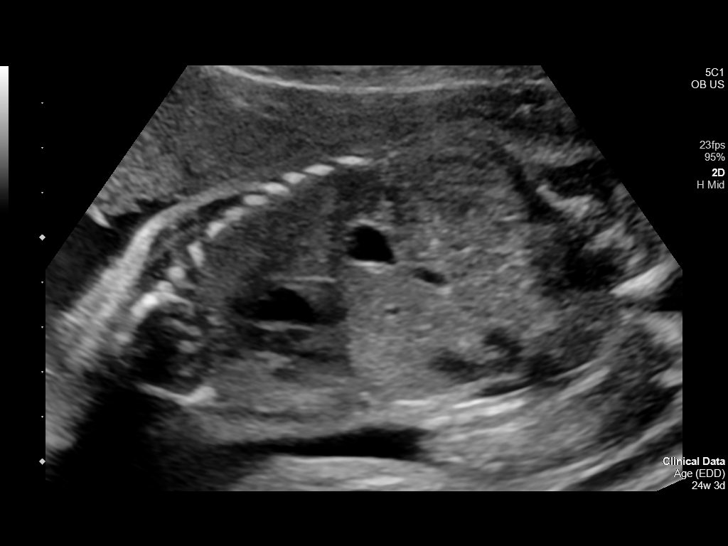
[im 40/97]
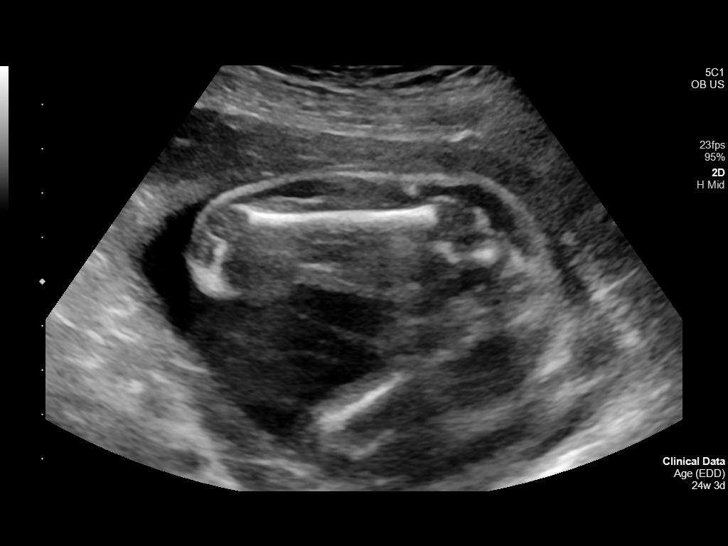
[im 50/97]
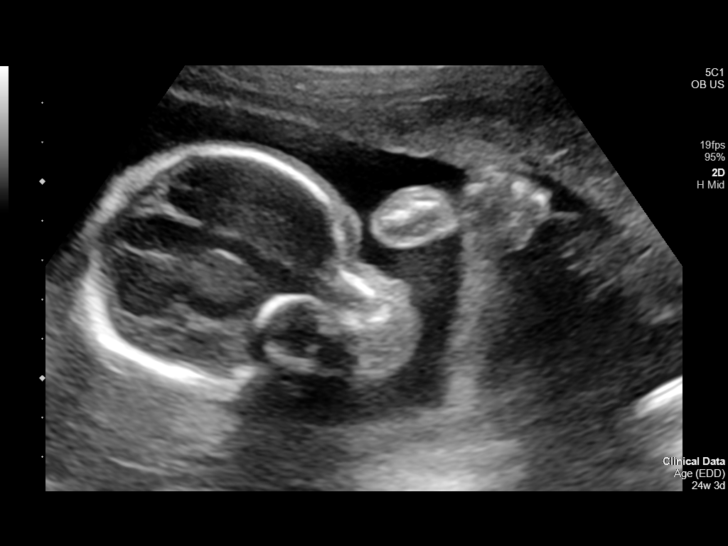
[im 57/97]
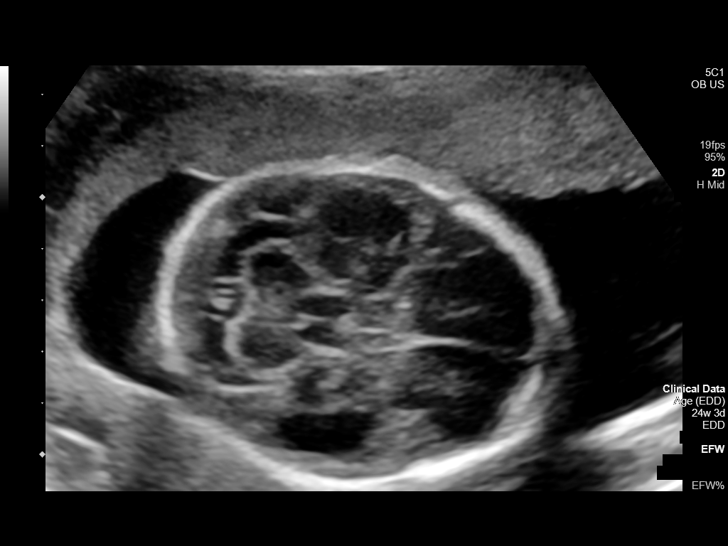
[im 65/97]
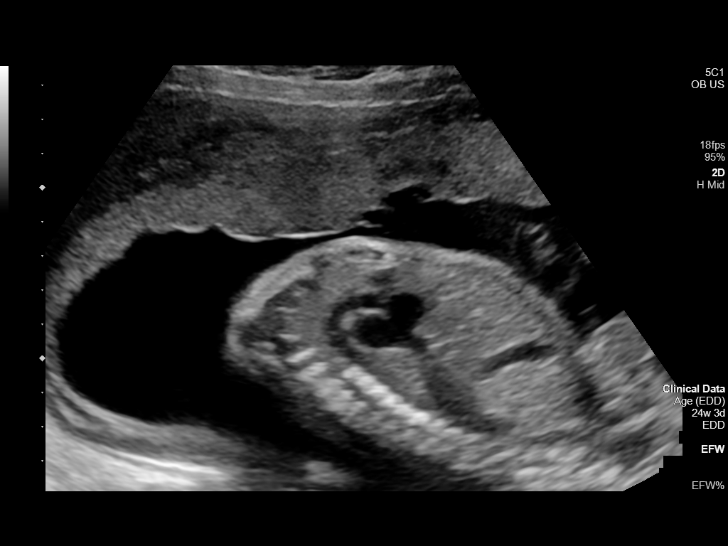
[im 72/97]
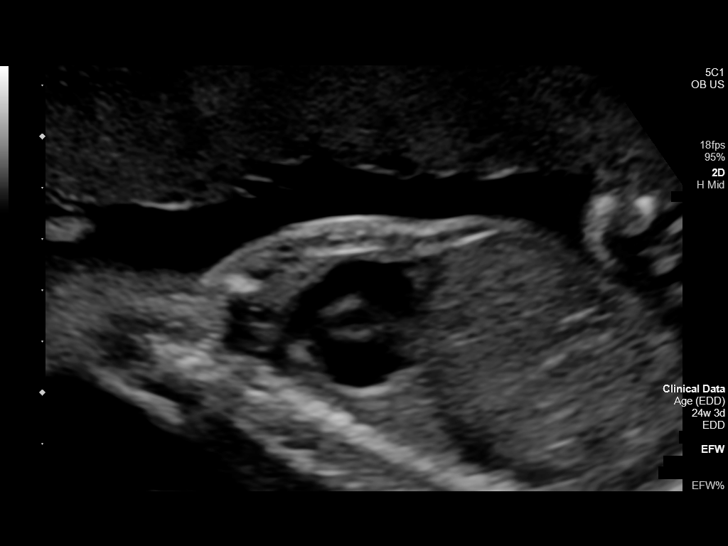
[im 79/97]
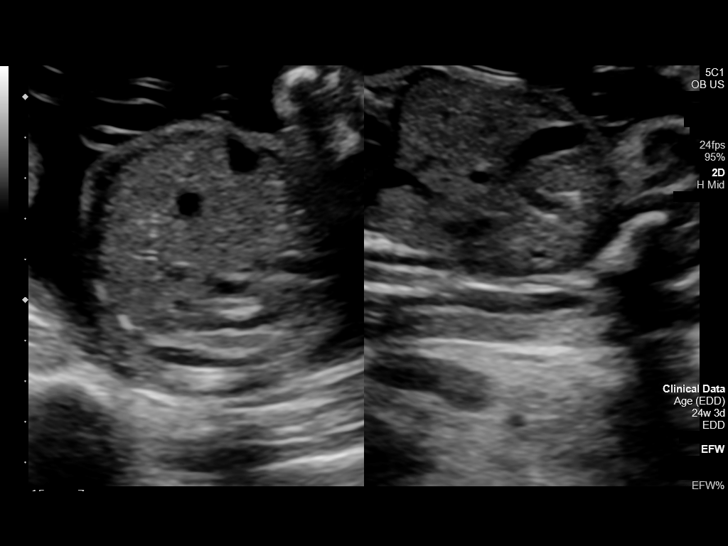
[im 86/97]
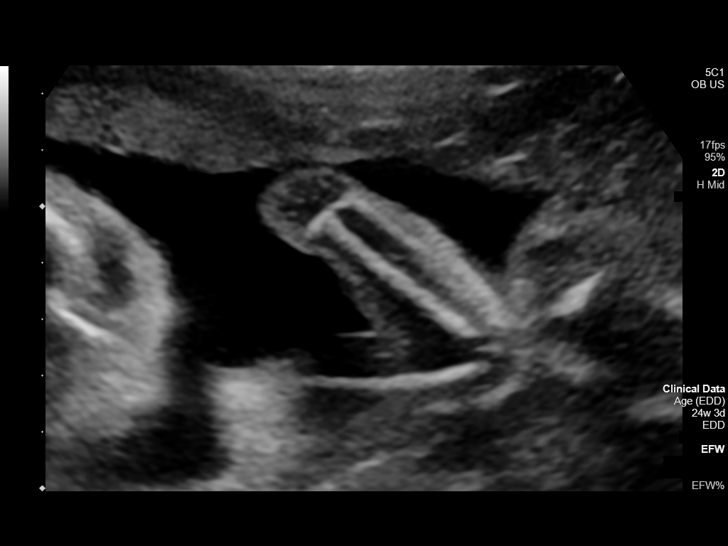
[im 93/97]
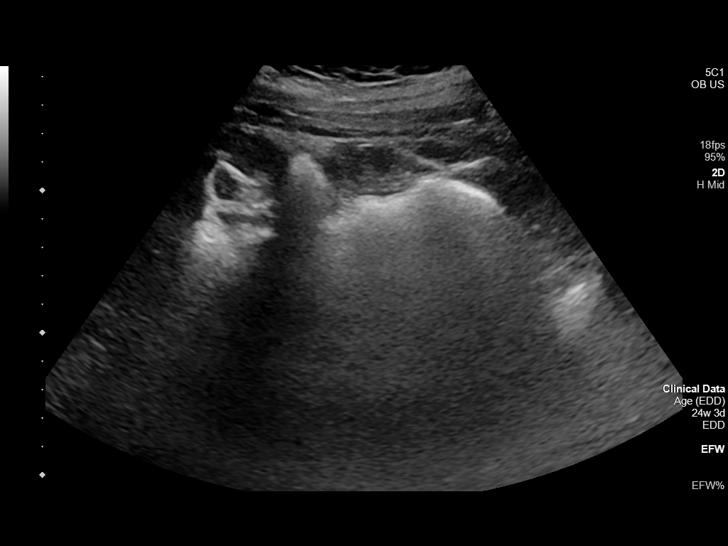

[13 of 28 positions shown; findings below may reference images not displayed]

FINDINGS: Number of Fetuses: 1

Heart Rate:  145 bpm

Movement: Yes

Presentation: Breech

Previa: No

Placental Location: Anterior

Amniotic Fluid (Subjective): Normal

Amniotic Fluid (Objective):

Vertical pocket 6.4cm

FETAL BIOMETRY

BPD:  6.5cm 26w 3d

HC:    23.6cm 25w 5d

AC:   22.5cm 26w 6d

FL:   4.3cm 24w 1d

Current Mean GA: 25w 4d              US EDC: 10/22/2021

Assigned GA: 24w 3d Assigned EDC: 10/30/2021

Estimated Fetal Weight:  852g 93%ile

FETAL ANATOMY

Lateral Ventricles: Appears normal

Thalami/CSP: Appears normal

Posterior Fossa: Appears normal

Nuchal Region: Not visualized    NFT= N/A > 20 WKS

Upper Lip: Appears normal

Spine: Appears normal

4 Chamber Heart on Left: Appears normal

LVOT: Appears normal

RVOT: Appears normal

Stomach on Left: Appears normal

3 Vessel Cord: Appears normal

Cord Insertion site: Appears normal

Kidneys: Appears normal

Bladder: Appears normal

Extremities: Appears normal

Sex: Male

Technical Limitations: Fetal position

Maternal Findings:

Cervix:  3.3 cm, appears normal.
IMPRESSION: 1. Single live intrauterine gestation with approximate gestational
age of 24 weeks and 4 days, EDC based on today's sonogram.
2. Normal anatomical survey. Limited evaluation of nuchal region due
to advanced gestational age and fetal positioning.

## 2022-07-21 ENCOUNTER — Ambulatory Visit: Payer: Medicaid Other

## 2022-07-22 ENCOUNTER — Ambulatory Visit: Payer: Medicaid Other

## 2022-07-23 ENCOUNTER — Other Ambulatory Visit (HOSPITAL_COMMUNITY)
Admission: RE | Admit: 2022-07-23 | Discharge: 2022-07-23 | Disposition: A | Discharge status: Home / Self Care | Payer: Medicaid Other | Source: Ambulatory Visit | Attending: Licensed Practical Nurse | Admitting: Licensed Practical Nurse

## 2022-07-23 ENCOUNTER — Ambulatory Visit (INDEPENDENT_AMBULATORY_CARE_PROVIDER_SITE_OTHER): Payer: Medicaid Other

## 2022-07-23 VITALS — Ht 64.0 in | Wt 299.0 lb

## 2022-07-23 DIAGNOSIS — R399 Unspecified symptoms and signs involving the genitourinary system: Secondary | ICD-10-CM

## 2022-07-23 DIAGNOSIS — N898 Other specified noninflammatory disorders of vagina: Secondary | ICD-10-CM | POA: Insufficient documentation

## 2022-07-23 LAB — POCT URINALYSIS DIPSTICK
Bilirubin, UA: NEGATIVE
Blood, UA: NEGATIVE
Glucose, UA: NEGATIVE
Ketones, UA: 1
Nitrite, UA: NEGATIVE
Protein, UA: POSITIVE — AB
Spec Grav, UA: 1.015 (ref 1.010–1.025)
Urobilinogen, UA: 0.2 E.U./dL
pH, UA: 5 (ref 5.0–8.0)

## 2022-07-23 NOTE — Progress Notes (Signed)
    NURSE VISIT NOTE  Subjective:    Patient ID: Amanda Buck, female    DOB: 07/28/1995, 27 y.o.   MRN: [id number]       HPI  Patient is a 27 y.o. G10P2002 female who presents for a nurse visit for UTI symptoms and self swab. She is currently having vaginal discharge and abnormal vaginal odor for the last week. For the last couple of weeks she has been having little burning urinating and noticed blood when wiped once. She denies frequent urination, blood in urine, vaginal itching, pelvic and/or back pain. Patient does not have a history of recurrent UTI.  Patient does not have a history of pyelonephritis.    Objective:    Ht  (1.626 m)   Wt 299 lb (135.6 kg)   Breastfeeding No   BMI 51.32 kg/m    Lab Review  Results for orders placed or performed in visit on 07/23/22  POCT Urinalysis Dipstick  Result Value Ref Range   Color, UA     Clarity, UA     Glucose, UA Negative Negative   Bilirubin, UA neg    Ketones, UA 1    Spec Grav, UA 1.015 1.010 - 1.025   Blood, UA neg    pH, UA 5.0 5.0 - 8.0   Protein, UA Positive (A) Negative   Urobilinogen, UA 0.2 0.2 or 1.0 E.U./dL   Nitrite, UA neg    Leukocytes, UA Small (1+) (A) Negative   Appearance     Odor      Assessment:   1. Vaginal odor   2. UTI symptoms      Plan:   Aptima swab sent. Urine Culture Sent. Maintain adequate hydration.  May use AZO OTC prn.  Follow up if symptoms worsen or fail to improve as anticipated, and as needed.  Patient will wait for results to be treated if needed.   Donnetta Hail, CMA

## 2022-07-23 NOTE — Patient Instructions (Addendum)
Urinary Tract Infection, Adult A urinary tract infection (UTI) is an infection of any part of the urinary tract. The urinary tract includes: The kidneys. The ureters. The bladder. The urethra. These organs make, store, and get rid of pee (urine) in the body. What are the causes? This infection is caused by germs (bacteria) in your genital area. These germs grow and cause swelling (inflammation) of your urinary tract. What increases the risk? The following factors may make you more likely to develop this condition: Using a small, thin tube (catheter) to drain pee. Not being able to control when you pee or poop (incontinence). Being female. If you are female, these things can increase the risk: Using these methods to prevent pregnancy: A medicine that kills sperm (spermicide). A device that blocks sperm (diaphragm). Having low levels of a female hormone (estrogen). Being pregnant. You are more likely to develop this condition if: You have genes that add to your risk. You are sexually active. You take antibiotic medicines. You have trouble peeing because of: A prostate that is bigger than normal, if you are female. A blockage in the part of your body that drains pee from the bladder. A kidney stone. A nerve condition that affects your bladder. Not getting enough to drink. Not peeing often enough. You have other conditions, such as: Diabetes. A weak disease-fighting system (immune system). Sickle cell disease. Gout. Injury of the spine. What are the signs or symptoms? Symptoms of this condition include: Needing to pee right away. Peeing small amounts often. Pain or burning when peeing. Blood in the pee. Pee that smells bad or not like normal. Trouble peeing. Pee that is cloudy. Fluid coming from the vagina, if you are female. Pain in the belly or lower back. Other symptoms include: Vomiting. Not feeling hungry. Feeling mixed up (confused). This may be the first symptom in  older adults. Being tired and grouchy (irritable). A fever. Watery poop (diarrhea). How is this treated? Taking antibiotic medicine. Taking other medicines. Drinking enough water. In some cases, you may need to see a specialist. Follow these instructions at home:  Medicines Take over-the-counter and prescription medicines only as told by your doctor. If you were prescribed an antibiotic medicine, take it as told by your doctor. Do not stop taking it even if you start to feel better. General instructions Make sure you: Pee until your bladder is empty. Do not hold pee for a long time. Empty your bladder after sex. Wipe from front to back after peeing or pooping if you are a female. Use each tissue one time when you wipe. Drink enough fluid to keep your pee pale yellow. Keep all follow-up visits. Contact a doctor if: You do not get better after 1-2 days. Your symptoms go away and then come back. Get help right away if: You have very bad back pain. You have very bad pain in your lower belly. You have a fever. You have chills. You feeling like you will vomit or you vomit. Summary A urinary tract infection (UTI) is an infection of any part of the urinary tract. This condition is caused by germs in your genital area. There are many risk factors for a UTI. Treatment includes antibiotic medicines. Drink enough fluid to keep your pee pale yellow. This information is not intended to replace advice given to you by your health care provider. Make sure you discuss any questions you have with your health care provider. Document Revised: 11/02/2019 Document Reviewed: 11/02/2019 Elsevier Patient Education    2023 Elsevier Inc.  Bacterial Vaginosis  Bacterial vaginosis is an infection of the vagina. It happens when too many normal germs (healthy bacteria) grow in the vagina. This infection can make it easier to get other infections from sex (STIs). It is very important for pregnant women to  get treated. This infection can cause babies to be born early or at a low birth weight. What are the causes? This infection is caused by an increase in certain germs that grow in the vagina. You cannot get this infection from toilet seats, bedsheets, swimming pools, or things that touch your vagina. What increases the risk? Having sex with a new person or more than one person. Having sex without protection. Douching. Having an intrauterine device (IUD). Smoking. Using drugs or drinking alcohol. These can lead you to do things that are risky. Taking certain antibiotic medicines. Being pregnant. What are the signs or symptoms? Some women have no symptoms. Symptoms may include: A discharge from your vagina. It may be gray or white. It can be watery or foamy. A fishy smell. This can happen after sex or during your menstrual period. Itching in and around your vagina. A feeling of burning or pain when you pee (urinate). How is this treated? This infection is treated with antibiotic medicines. These may be given to you as: A pill. A cream for your vagina. A medicine that you put into your vagina (suppository). If the infection comes back after treatment, you may need more antibiotics. Follow these instructions at home: Medicines Take over-the-counter and prescription medicines as told by your doctor. Take or use your antibiotic medicine as told by your doctor. Do not stop taking or using it, even if you start to feel better. General instructions If the person you have sex with is a woman, tell her that you have this infection. She will need to follow up with her doctor. If you have a female partner, he does not need to be treated. Do not have sex until you finish treatment. Drink enough fluid to keep your pee pale yellow. Keep your vagina and butt clean. Wash the area with warm water each day. Wipe from front to back after you use the toilet. If you are breastfeeding a baby, ask your  doctor if you should keep doing so during treatment. Keep all follow-up visits. How is this prevented? Self-care Do not douche. Use only warm water to wash around your vagina. Wear underwear that is cotton or lined with cotton. Do not wear tight pants and pantyhose, especially in the summer. Safe sex Use protection when you have sex. This includes: Use condoms. Use dental dams. This is a thin layer that protects the mouth during oral sex. Limit how many people you have sex with. To prevent this infection, it is best to have sex with just one person. Get tested for STIs. The person you have sex with should also get tested. Drugs and alcohol Do not smoke or use any products that contain nicotine or tobacco. If you need help quitting, ask your doctor. Do not use drugs. Do not drink alcohol if: Your doctor tells you not to drink. You are pregnant, may be pregnant, or are planning to become pregnant. If you drink alcohol: Limit how much you have to 0-1 drink a day. Know how much alcohol is in your drink. In the [place] one drink equals one 12 oz bottle of beer (355 mL), one 5 oz glass of wine (148 mL), or one 1 oz  glass of hard liquor (44 mL). Where to find more information Centers for Disease Control and Prevention: FootballExhibition.com.br American Sexual Health Association: www.ashastd.org Office on [employer] Health: http://hoffman.com/ Contact a doctor if: Your symptoms do not get better, even after you are treated. You have more discharge or pain when you pee. You have a fever or chills. You have pain in your belly (abdomen) or in the area between your hips. You have pain with sex. You bleed from your vagina between menstrual periods. Summary This infection can happen when too many germs (bacteria) grow in the vagina. This infection can make it easier to get infections from sex (STIs). Treating this can lower that chance. Get treated if you are pregnant. This infection can cause babies to be  born early. Do not stop taking or using your antibiotic medicine, even if you start to feel better. This information is not intended to replace advice given to you by your health care provider. Make sure you discuss any questions you have with your health care provider. Document Revised: 09/20/2019 Document Reviewed: 09/20/2019 Elsevier Patient Education  2023 Elsevier Inc.  Chlamydia, Female  Chlamydia is a sexually transmitted infection (STI). This infection spreads through sexual contact. Chlamydia can occur in different areas of the body, including: The urethra. This is the part of the body that drains urine from the bladder. The cervix. This is the lowest part of the uterus. The throat. The rectum. This condition is not difficult to treat. However, if left untreated, chlamydia can lead to more serious health problems, including pelvic inflammatory disease (PID). PID can increase your risk of being unable to have children. In pregnant women, untreated chlamydia can cause serious complications during pregnancy or health problems for the baby after delivery. What are the causes? This condition is caused by a bacteria called Chlamydia trachomatis. The bacteria are spread from an infected partner during sexual activity. Chlamydia can spread through contact with the genitals, mouth, or rectum. What increases the risk? The following factors may make you more likely to develop this condition: Not using a condom the right way or not using a condom every time you have sex. Having a new sex partner or having more than one sex partner. Being sexually active before age 73. What are the signs or symptoms? In some cases, there are no symptoms, especially early in the infection. If symptoms develop, they may include: Urinating often, or a burning feeling during urination. Redness, soreness, or swelling of the vagina or rectum. Discharge coming from the vagina or rectum. Pain in the abdomen. Pain  during sex. Bleeding between menstrual periods or irregular periods. How is this diagnosed? This condition may be diagnosed with: Urine tests. Swab tests. Depending on your symptoms, your health care provider may use a cotton swab to collect a fluid sample from your vagina, rectum, nose, or throat to test for the bacteria. A pelvic exam. How is this treated? This condition is treated with antibiotic medicines. Follow these instructions at home: Sexual activity Tell your sex partner or partners about your infection. These include any partners for oral, anal, or vaginal sex that you have had within 60 days of when your symptoms started. Sex partners should also be treated, even if they have no signs of the infection. Do not have sex until you and your sex partners have completed treatment and your health care provider says it is okay. If your health care provider prescribed you a single-dose medicine as treatment, wait at least 7  days after taking the medicine before having sex. General instructions Take over-the-counter and prescription medicines as told by your health care provider. Finish all antibiotic medicine even when you start to feel better. It is up to you to get your test results. Ask your health care provider, or the department that is doing the test, when your results will be ready. Keep all follow-up visits. This is important. You may need to be tested for infection again 3 months after treatment. How is this prevented? You can lower your risk of getting chlamydia by: Using latex or polyurethane condoms correctly every time you have sex. Not having multiple sex partners. Asking if your sex partner has been tested for STIs and had negative results. Getting regular health screenings to check for STIs. Contact a health care provider if: You develop new symptoms or your symptoms are getting worse. Your symptoms do not get better after treatment. You have a fever or chills. You have  pain during sex. You have irregular menstrual periods, or you have bleeding between periods or after sex. You develop flu-like symptoms, such as night sweats, sore throat, or muscle aches. You are unable to take your antibiotic medicine as prescribed. Summary Chlamydia is a sexually transmitted infection (STI) that is caused by bacteria. This infection spreads through sexual contact. This condition is treated with antibiotic medicines. If left untreated, chlamydia can lead to more serious health problems, including pelvic inflammatory disease (PID). Your sex partners will also need to be treated. Do not have sex until both you and your partner have been treated. Take medicines as directed by your health care provider and keep all follow-up visits to ensure your infection has been completely treated. This information is not intended to replace advice given to you by your health care provider. Make sure you discuss any questions you have with your health care provider. Document Revised: 01/12/2021 Document Reviewed: 01/12/2021 Elsevier Patient Education  2023 [employer].

## 2022-07-26 LAB — CERVICOVAGINAL ANCILLARY ONLY
Bacterial Vaginitis (gardnerella): NEGATIVE
Candida Glabrata: NEGATIVE
Candida Vaginitis: NEGATIVE
Chlamydia: NEGATIVE
Comment: NEGATIVE
Comment: NEGATIVE
Comment: NEGATIVE
Comment: NEGATIVE
Comment: NEGATIVE
Comment: NORMAL
Neisseria Gonorrhea: NEGATIVE
Trichomonas: NEGATIVE

## 2022-07-27 ENCOUNTER — Telehealth: Payer: Self-pay

## 2022-07-27 ENCOUNTER — Other Ambulatory Visit: Payer: Self-pay

## 2022-07-27 LAB — URINE CULTURE

## 2022-07-27 MED ORDER — NITROFURANTOIN MONOHYD MACRO 100 MG PO CAPS: 100.0000 mg | ORAL_CAPSULE | Freq: Two times a day (BID) | ORAL | 1 refills | Status: AC

## 2022-07-27 NOTE — Progress Notes (Signed)
Pt called; has questions/concerns about urine results.  [phone]  Questions answered and Macrobid eRx'd. As per new protocol.

## 2022-07-27 NOTE — Telephone Encounter (Signed)
Pt calling with questions/concerns about urine results. Returned pt's call; questions/concerns answered and eRx'd Macrobid as per new protocol.

## 2022-08-13 IMAGING — US US OB FOLLOW-UP
1 series · 14 of 28 positions shown · non-contrast
Comparison: none

CLINICAL DATA: Evaluate growth

EXAM:
OBSTETRIC 14+ WK ULTRASOUND FOLLOW-UP

[Series 1: us ob follow up · 53 acquisitions, 14 frames shown]
[im 2/53]
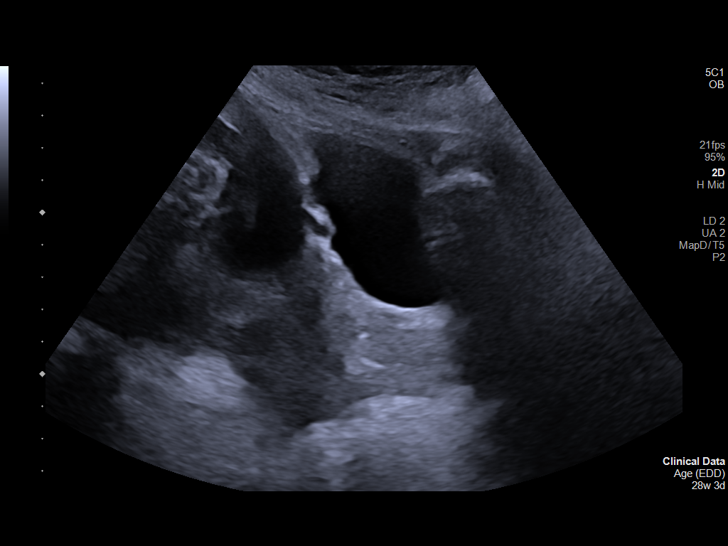
[im 6/53]
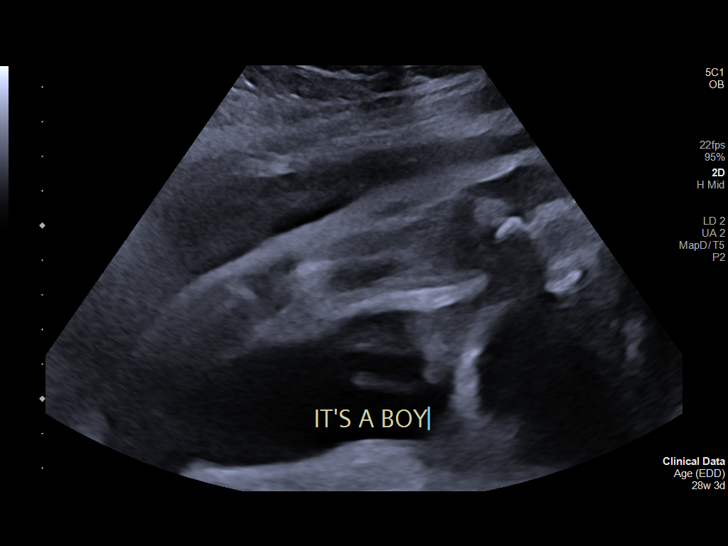
[im 10/53]
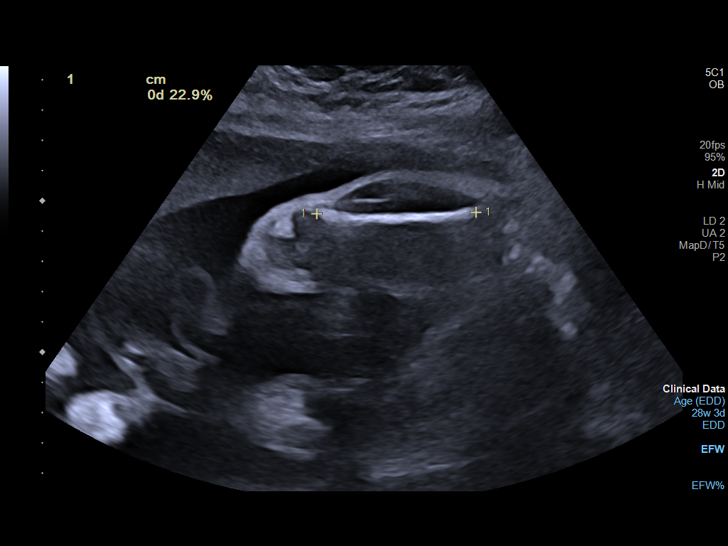
[im 14/53]
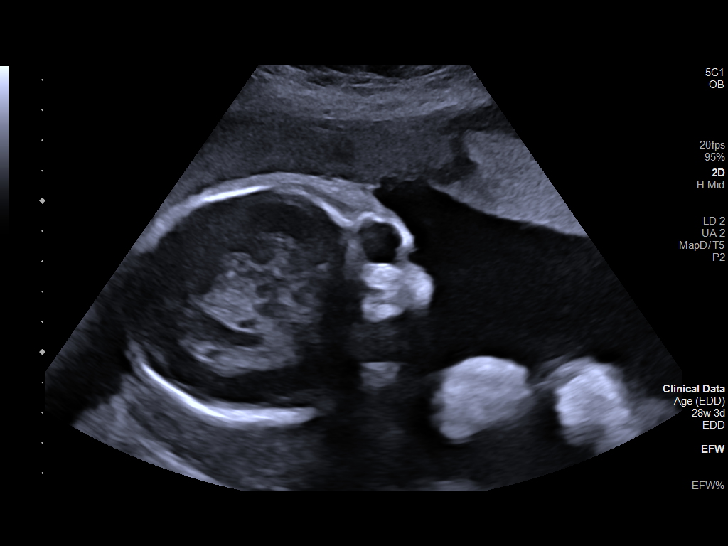
[im 18/53]
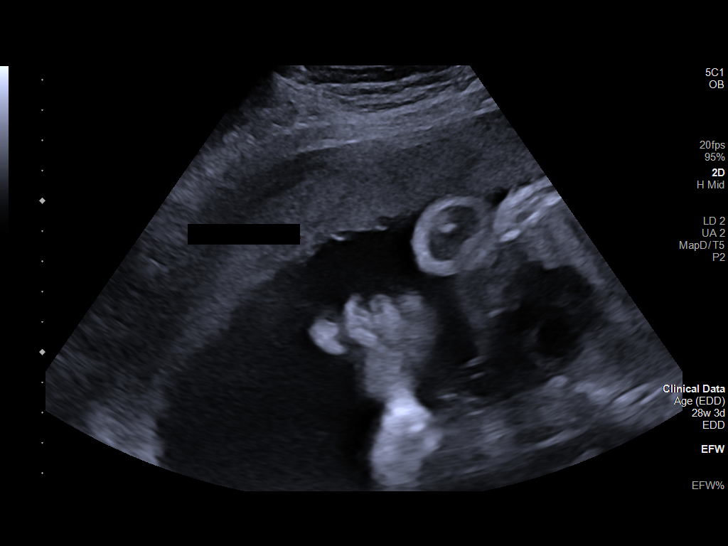
[im 22/53]
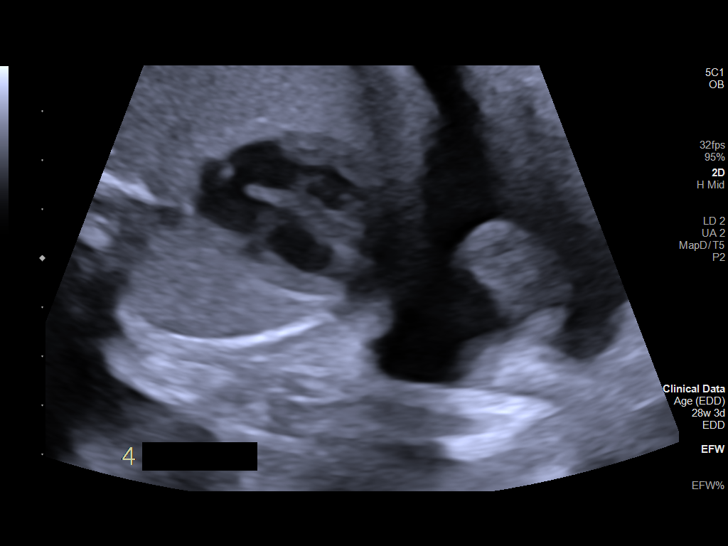
[im 26/53]
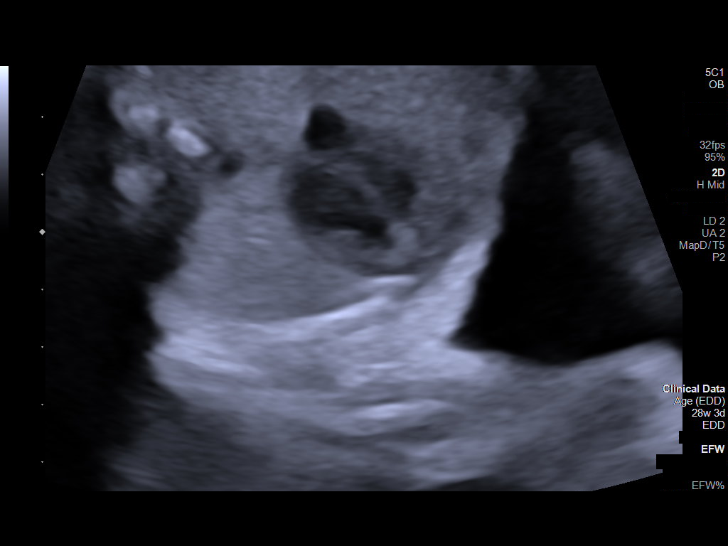
[im 29/53]
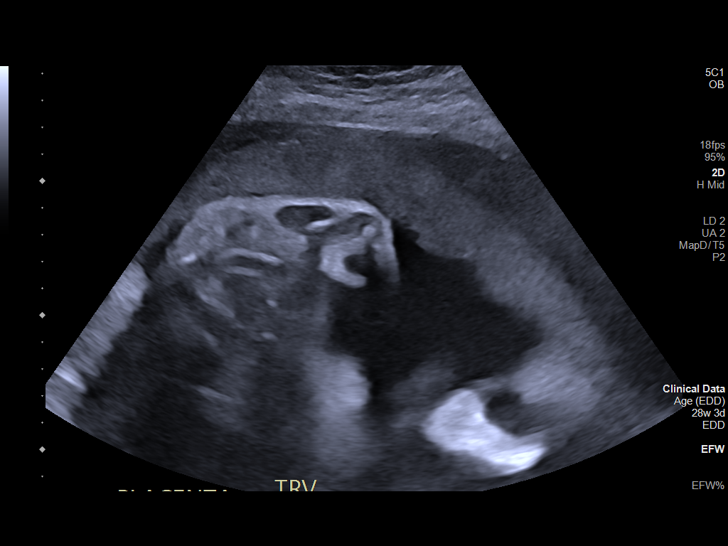
[im 33/53]
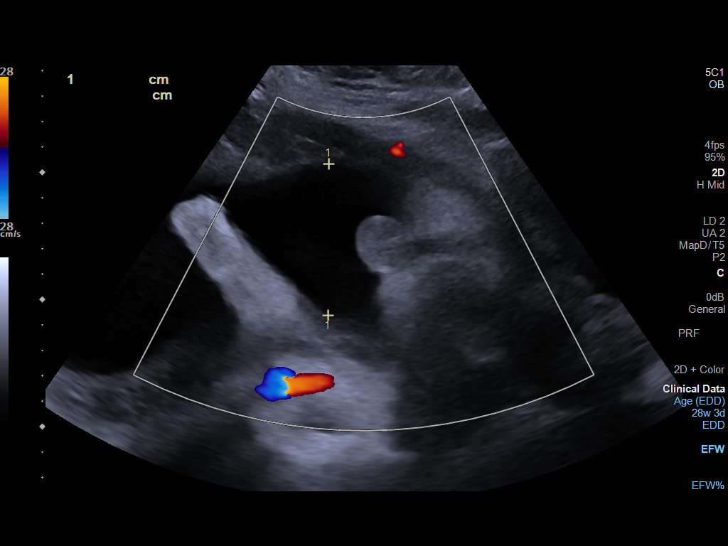
[im 37/53]
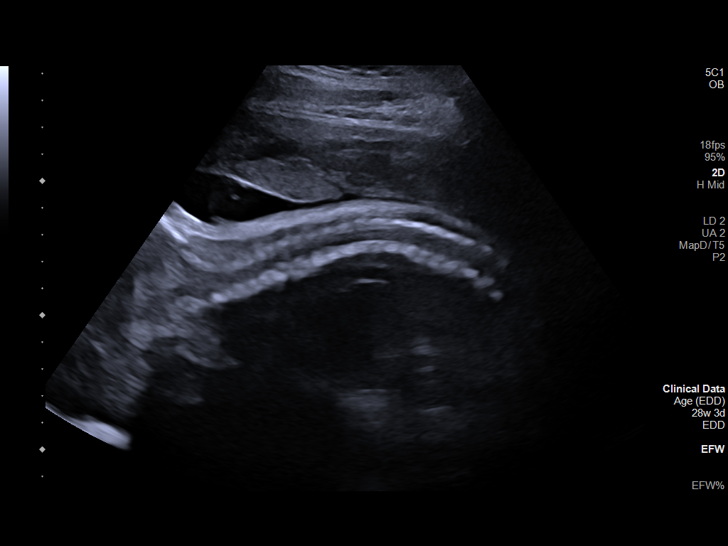
[im 41/53]
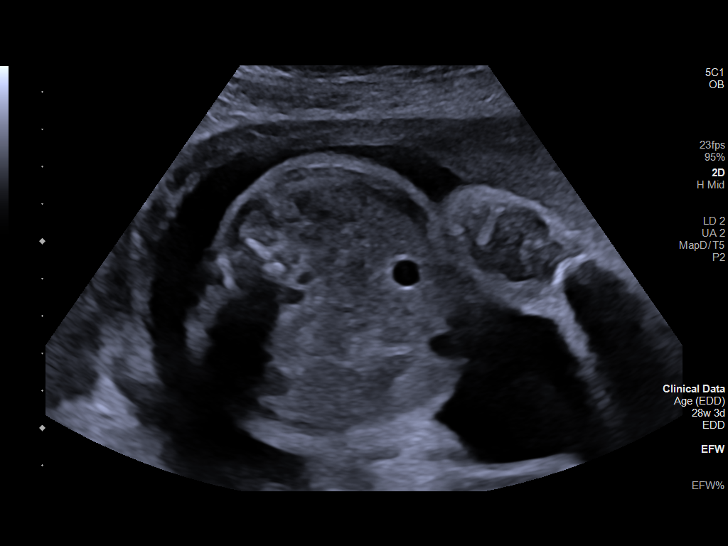
[im 45/53]
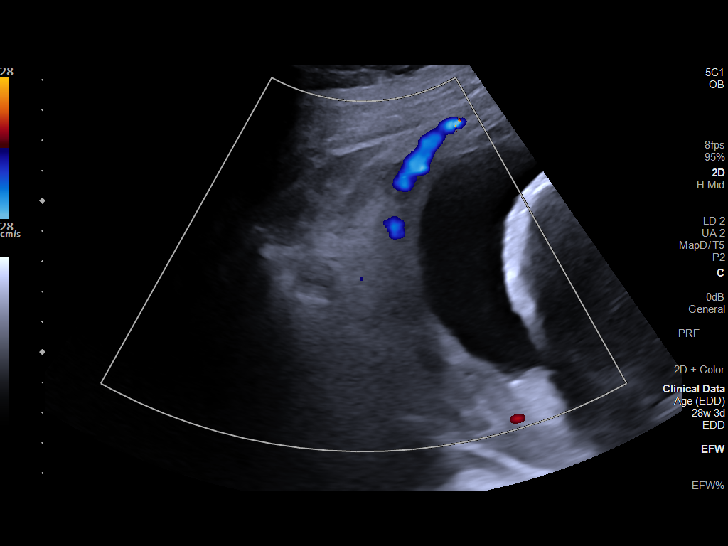
[im 49/53]
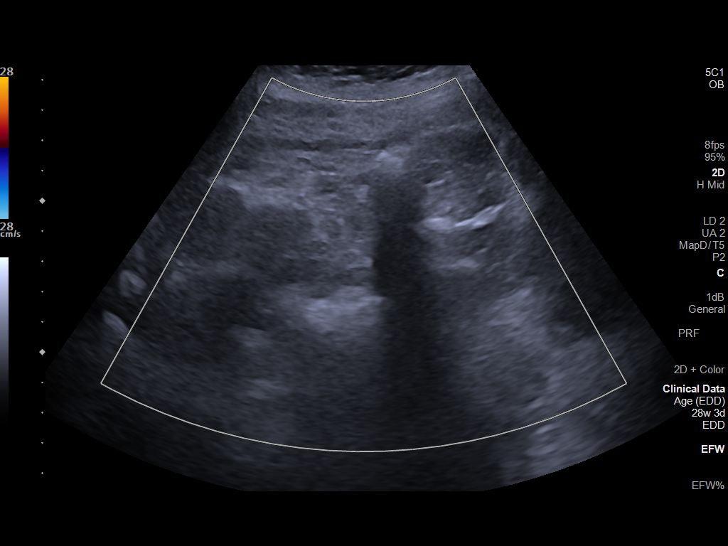
[im 53/53]
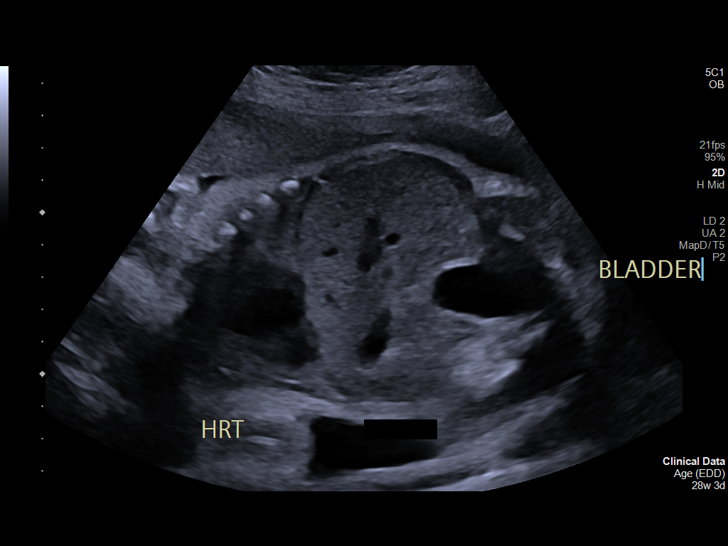

[14 of 28 positions shown; findings below may reference images not displayed]

FINDINGS: Number of Fetuses: 1

Heart Rate:  148 bpm

Movement: Yes

Presentation: Breech

Previa: No

Placental Location: Anterior

Amniotic Fluid (Subjective):

Amniotic Fluid (Objective): Normal

Vertical pocket 9.7cm

AFI 20.1 cm

FETAL BIOMETRY

BPD:  7.7cm 30w 6d

HC:    27.9cm 30w 4d

AC:    26.5cm 30w 4d

FL:    5.3cm 28w 0d

Current Mean GA: 30w 0d              US EDC: 10/19/2021

Assigned GA: 28w 3d     Assigned EDC: 10/30/2021

Estimated Fetal Weight:  1,456g 86%ile

FETAL ANATOMY

Previously performed.

Maternal Findings:

Cervix:  3.6 cm
IMPRESSION: Single live intrauterine pregnancy with estimated gestational age of
30 weeks 0 days based on the current study. No acute abnormality
visualized.

## 2023-09-08 DIAGNOSIS — M5441 Lumbago with sciatica, right side: Secondary | ICD-10-CM | POA: Diagnosis not present

## 2023-09-08 DIAGNOSIS — R8290 Unspecified abnormal findings in urine: Secondary | ICD-10-CM | POA: Diagnosis not present
